# Patient Record
Sex: Female | Born: 1995 | Hispanic: No | Smoking: Never smoker
Health system: Southern US, Community
[De-identification: ages and names within clinical notes are randomized; demographics above are authoritative.]

## PROBLEM LIST (undated history)

## (undated) DIAGNOSIS — J45909 Unspecified asthma, uncomplicated: Secondary | ICD-10-CM

## (undated) DIAGNOSIS — E559 Vitamin D deficiency, unspecified: Secondary | ICD-10-CM

## (undated) DIAGNOSIS — D649 Anemia, unspecified: Secondary | ICD-10-CM

## (undated) DIAGNOSIS — F410 Panic disorder [episodic paroxysmal anxiety] without agoraphobia: Secondary | ICD-10-CM

## (undated) DIAGNOSIS — F4310 Post-traumatic stress disorder, unspecified: Secondary | ICD-10-CM

## (undated) DIAGNOSIS — F319 Bipolar disorder, unspecified: Secondary | ICD-10-CM

## (undated) HISTORY — PX: WISDOM TOOTH EXTRACTION: SHX21

## (undated) HISTORY — DX: Vitamin D deficiency, unspecified: E55.9

## (undated) HISTORY — DX: Anemia, unspecified: D64.9

---

## 2013-03-04 ENCOUNTER — Encounter (HOSPITAL_COMMUNITY): Payer: Self-pay | Admitting: Emergency Medicine

## 2013-03-04 ENCOUNTER — Emergency Department (HOSPITAL_COMMUNITY): Payer: BC Managed Care – PPO

## 2013-03-04 ENCOUNTER — Emergency Department (HOSPITAL_COMMUNITY)
Admission: EM | Admit: 2013-03-04 | Discharge: 2013-03-04 | Disposition: A | Discharge status: Home / Self Care | Payer: BC Managed Care – PPO | Attending: Emergency Medicine | Admitting: Emergency Medicine

## 2013-03-04 DIAGNOSIS — S5292XA Unspecified fracture of left forearm, initial encounter for closed fracture: Secondary | ICD-10-CM

## 2013-03-04 DIAGNOSIS — W06XXXA Fall from bed, initial encounter: Secondary | ICD-10-CM | POA: Insufficient documentation

## 2013-03-04 DIAGNOSIS — Y939 Activity, unspecified: Secondary | ICD-10-CM | POA: Insufficient documentation

## 2013-03-04 DIAGNOSIS — F17200 Nicotine dependence, unspecified, uncomplicated: Secondary | ICD-10-CM | POA: Insufficient documentation

## 2013-03-04 DIAGNOSIS — J45909 Unspecified asthma, uncomplicated: Secondary | ICD-10-CM | POA: Insufficient documentation

## 2013-03-04 DIAGNOSIS — Y929 Unspecified place or not applicable: Secondary | ICD-10-CM | POA: Insufficient documentation

## 2013-03-04 DIAGNOSIS — S52599A Other fractures of lower end of unspecified radius, initial encounter for closed fracture: Secondary | ICD-10-CM | POA: Insufficient documentation

## 2013-03-04 HISTORY — DX: Unspecified asthma, uncomplicated: J45.909

## 2013-03-04 MED ORDER — HYDROCODONE-ACETAMINOPHEN 5-325 MG PO TABS: 2.0000 | ORAL_TABLET | Freq: Four times a day (QID) | ORAL | Status: DC | PRN

## 2013-03-04 MED ORDER — HYDROCODONE-ACETAMINOPHEN 5-325 MG PO TABS
2.0000 | ORAL_TABLET | Freq: Once | ORAL | Status: AC
  Administered 2013-03-04: 2 via ORAL
  Filled 2013-03-04: qty 2

## 2013-03-04 NOTE — ED Notes (Signed)
Pt states that she has a loft bed and slipped and fell with all her weight on the left hand/wrist. Pt c/o pain 3/10. Pt does have swelling to left wrist and unable to rotate wrist at this time.

## 2013-03-04 NOTE — ED Notes (Signed)
Ortho tech at bedside 

## 2013-03-04 NOTE — ED Provider Notes (Signed)
CSN: [id number]     Arrival date & time 03/04/13  1356 History  This chart was scribed for non-physician practitioner Roxy Horseman, PA-C, working with Shanna Cisco, MD by Dorothey Baseman, ED Scribe. This patient was seen in room WTR9/WTR9 and the patient's care was started at 2:42 PM.    Chief Complaint  Patient presents with  . Wrist Pain    left   The history is provided by the patient. No language interpreter was used.   HPI Comments: Kelsey Shaffer is a 17 y.o. female who presents to the Emergency Department complaining of a constant pain, described as a stiffness, with associated swelling to the left hand and wrist onset around 2 hours ago secondary to fall from a lofted bed when she states that she placed all of her weight on the left hand and wrist. She denies taking any medications at home to manage her symptoms. She denies any allergies to medications. She denies history of fractures. Patient denies any other pertinent medical history.  Past Medical History  Diagnosis Date  . Asthma    History reviewed. No pertinent past surgical history. No family history on file. History  Substance Use Topics  . Smoking status: Light Tobacco Smoker  . Smokeless tobacco: Not on file  . Alcohol Use: Yes     Comment: social   OB History   Grav Para Term Preterm Abortions TAB SAB Ect Mult Living                 Review of Systems  A complete 10 system review of systems was obtained and all systems are negative except as noted in the HPI and PMH.   Allergies  Review of patient's allergies indicates no known allergies.  Home Medications  No current outpatient prescriptions on file.  Triage Vitals: BP 120/57  Pulse 89  Temp(Src) 98.3 F (36.8 C) (Oral)  Resp 17  Ht 5\' 5"  (1.651 m)  Wt 190 lb (86.183 kg)  BMI 31.62 kg/m2  SpO2 100%  LMP 02/23/2013  Physical Exam  Nursing note and vitals reviewed. Constitutional: She is oriented to person, place, and time. She appears  well-developed and well-nourished. No distress.  HENT:  Head: Normocephalic and atraumatic.  Eyes: Conjunctivae are normal.  Neck: Normal range of motion. Neck supple.  Cardiovascular: Intact distal pulses.   Brisk capillary refill.   Pulmonary/Chest: Effort normal. No respiratory distress.  Abdominal: She exhibits no distension.  Musculoskeletal: Normal range of motion.  Range of motion and strength limited secondary to pain.   Neurological: She is alert and oriented to person, place, and time.  Distal sensation intact.   Skin: Skin is warm and dry.  Psychiatric: She has a normal mood and affect. Her behavior is normal.    ED Course  Procedures (including critical care time)  DIAGNOSTIC STUDIES: Oxygen Saturation is 100% on room air, normal by my interpretation.    COORDINATION OF CARE: 2:44 PM- Discussed that x-ray results indicate a nondisplaced fracture to the left distal radial metaphysis. Will order pain medication and discharge patient with a splint to manage symptoms. Advised patient to follow up with the referred hand surgeon. Discussed treatment plan with patient at bedside and patient verbalized agreement.     Labs Review Labs Reviewed - No data to display  Imaging Review Dg Wrist Complete Left  03/04/2013   CLINICAL DATA:  Left lateral wrist pain status post fall  EXAM: LEFT WRIST - COMPLETE 3+ VIEW  COMPARISON:  None.  FINDINGS: There is a nondisplaced fracture of the left distal radial metaphysis with a fracture cleft extending to the articular surface of the radiocarpal joint. There is no other fracture or dislocation. There is surrounding soft tissue swelling.  IMPRESSION: Nondisplaced fracture of the left distal radial metaphysis extending to the articular surface.   Electronically Signed   By: Elige Ko   On: 03/04/2013 14:40   Dg Hand Complete Left  03/04/2013   CLINICAL DATA:  Left lateral wrist pain status post fall  EXAM: LEFT HAND - COMPLETE 3+ VIEW   COMPARISON:  None.  FINDINGS: There is a nondisplaced fracture of the left distal radial metaphysis extending to the articular surface. There is no other fracture or dislocation. There is severe soft tissue swelling along the volar aspect of the distal radial metaphysis.  IMPRESSION: Nondisplaced fracture of the left distal radial metaphysis extending to the articular surface.   Electronically Signed   By: Elige Ko   On: 03/04/2013 14:41    EKG Interpretation   None       MDM   1. Radius fracture, left, closed, initial encounter    Patient with radius fracture.  Will split and give ortho follow-up.  Patient understands and agrees with the plan.  I personally performed the services described in this documentation, which was scribed in my presence. The recorded information has been reviewed and is accurate.      Roxy Horseman, PA-C 03/04/13 [phone]

## 2013-03-04 NOTE — ED Notes (Signed)
Patient transported to X-ray 

## 2013-03-05 NOTE — ED Provider Notes (Signed)
Medical screening examination/treatment/procedure(s) were performed by non-physician practitioner and as supervising physician I was immediately available for consultation/collaboration.  Megan E Docherty, MD 03/05/13 1213 

## 2013-10-29 DIAGNOSIS — F32A Depression, unspecified: Secondary | ICD-10-CM | POA: Insufficient documentation

## 2013-10-30 DIAGNOSIS — F3160 Bipolar disorder, current episode mixed, unspecified: Secondary | ICD-10-CM | POA: Insufficient documentation

## 2013-10-30 DIAGNOSIS — F419 Anxiety disorder, unspecified: Secondary | ICD-10-CM | POA: Insufficient documentation

## 2014-10-16 ENCOUNTER — Emergency Department (HOSPITAL_COMMUNITY)
Admission: EM | Admit: 2014-10-16 | Discharge: 2014-10-16 | Disposition: A | Discharge status: Home / Self Care | Payer: Self-pay | Attending: Emergency Medicine | Admitting: Emergency Medicine

## 2014-10-16 ENCOUNTER — Encounter (HOSPITAL_COMMUNITY): Payer: Self-pay | Admitting: Emergency Medicine

## 2014-10-16 DIAGNOSIS — J45909 Unspecified asthma, uncomplicated: Secondary | ICD-10-CM | POA: Insufficient documentation

## 2014-10-16 DIAGNOSIS — Z79899 Other long term (current) drug therapy: Secondary | ICD-10-CM | POA: Insufficient documentation

## 2014-10-16 DIAGNOSIS — F319 Bipolar disorder, unspecified: Secondary | ICD-10-CM | POA: Insufficient documentation

## 2014-10-16 DIAGNOSIS — F4310 Post-traumatic stress disorder, unspecified: Secondary | ICD-10-CM | POA: Insufficient documentation

## 2014-10-16 DIAGNOSIS — Z720 Tobacco use: Secondary | ICD-10-CM | POA: Insufficient documentation

## 2014-10-16 DIAGNOSIS — F410 Panic disorder [episodic paroxysmal anxiety] without agoraphobia: Secondary | ICD-10-CM | POA: Insufficient documentation

## 2014-10-16 DIAGNOSIS — N390 Urinary tract infection, site not specified: Secondary | ICD-10-CM | POA: Insufficient documentation

## 2014-10-16 DIAGNOSIS — Z793 Long term (current) use of hormonal contraceptives: Secondary | ICD-10-CM | POA: Insufficient documentation

## 2014-10-16 HISTORY — DX: Panic disorder (episodic paroxysmal anxiety): F41.0

## 2014-10-16 HISTORY — DX: Post-traumatic stress disorder, unspecified: F43.10

## 2014-10-16 HISTORY — DX: Bipolar disorder, unspecified: F31.9

## 2014-10-16 LAB — URINE MICROSCOPIC-ADD ON

## 2014-10-16 LAB — URINALYSIS, ROUTINE W REFLEX MICROSCOPIC
Bilirubin Urine: NEGATIVE
Glucose, UA: NEGATIVE mg/dL
KETONES UR: NEGATIVE mg/dL
Nitrite: NEGATIVE
PROTEIN: 100 mg/dL — AB
Specific Gravity, Urine: 1.025 (ref 1.005–1.030)
UROBILINOGEN UA: 0.2 mg/dL (ref 0.0–1.0)
pH: 6 (ref 5.0–8.0)

## 2014-10-16 MED ORDER — CEPHALEXIN 500 MG PO CAPS: 500.0000 mg | ORAL_CAPSULE | Freq: Two times a day (BID) | ORAL | Status: DC

## 2014-10-16 NOTE — ED Provider Notes (Signed)
CSN: [id number]     Arrival date & time 10/16/14  1655 History   First MD Initiated Contact with Patient 10/16/14 1858     Chief Complaint  Patient presents with  . Dysuria  . Chills     (Consider location/radiation/quality/duration/timing/severity/associated sxs/prior Treatment) HPI   Blood pressure 120/67, pulse 93, temperature 98.1 F (36.7 C), temperature source Oral, resp. rate 18, last menstrual period 09/25/2014, SpO2 99 %.  Kelsey Shaffer is a 19 y.o. female complaining of dysuria, urinary frequency and chills onset 10 days ago. Patient states that she started to self medicate with Azo and cranberry juice she states that she improved but then symptoms returned. She denies fever, nausea, vomiting, flank pain, abnormal vaginal discharge. States that she did not seek care immediately because she is uninsured and is concerned about money.  Past Medical History  Diagnosis Date  . Asthma   . Bipolar affective   . Panic anxiety syndrome   . PTSD (post-traumatic stress disorder)    No past surgical history on file. No family history on file. History  Substance Use Topics  . Smoking status: Light Tobacco Smoker  . Smokeless tobacco: Not on file  . Alcohol Use: Yes     Comment: social   OB History    No data available     Review of Systems  10 systems reviewed and found to be negative, except as noted in the HPI.   Allergies  Review of patient's allergies indicates no known allergies.  Home Medications   Prior to Admission medications   Medication Sig Start Date End Date Taking? Authorizing Provider  albuterol (PROVENTIL HFA;VENTOLIN HFA) 108 (90 BASE) MCG/ACT inhaler Inhale 2 puffs into the lungs every 6 (six) hours as needed for wheezing or shortness of breath.    Historical Provider, MD  cephALEXin (KEFLEX) 500 MG capsule Take 1 capsule (500 mg total) by mouth 2 (two) times daily. 10/16/14   Iann Rodier, PA-C  HYDROcodone-acetaminophen (NORCO/VICODIN) 5-325  MG per tablet Take 2 tablets by mouth every 6 (six) hours as needed. 03/04/13   Roxy Horseman, PA-C  Norethin-Eth Estrad-Fe Biphas (LO LOESTRIN FE PO) Take 1 tablet by mouth daily.    Historical Provider, MD  PRESCRIPTION MEDICATION Antibiotic unknown    Historical Provider, MD  sertraline (ZOLOFT) 25 MG tablet Take 75 mg by mouth at bedtime.    Historical Provider, MD   BP 120/67 mmHg  Pulse 93  Temp(Src) 98.1 F (36.7 C) (Oral)  Resp 18  SpO2 99%  LMP 09/25/2014 (Approximate) Physical Exam  Constitutional: She is oriented to person, place, and time. She appears well-developed and well-nourished. No distress.  HENT:  Head: Normocephalic and atraumatic.  Mouth/Throat: Oropharynx is clear and moist.  Eyes: Conjunctivae and EOM are normal. Pupils are equal, round, and reactive to light.  Neck: Normal range of motion.  Cardiovascular: Normal rate, regular rhythm and intact distal pulses.   Pulmonary/Chest: Effort normal and breath sounds normal.  Abdominal: Soft. There is no tenderness.  Genitourinary:  No CVAT b/l  Musculoskeletal: Normal range of motion.  Neurological: She is alert and oriented to person, place, and time.  Skin: She is not diaphoretic.  Psychiatric: She has a normal mood and affect.  Nursing note and vitals reviewed.   ED Course  Procedures (including critical care time) Labs Review Labs Reviewed  URINALYSIS, ROUTINE W REFLEX MICROSCOPIC (NOT AT [facility]) - Abnormal; Notable for the following:    APPearance TURBID (*)    Hgb  urine dipstick LARGE (*)    Protein, ur 100 (*)    Leukocytes, UA LARGE (*)    All other components within normal limits  URINE MICROSCOPIC-ADD ON - Abnormal; Notable for the following:    Bacteria, UA FEW (*)    All other components within normal limits  URINE CULTURE  POC URINE PREG, ED    Imaging Review No results found.   EKG Interpretation None      MDM   Final diagnoses:  UTI (lower urinary tract infection)     Filed Vitals:   10/16/14 1705 10/16/14 1919  BP: 124/72 120/67  Pulse: 95 93  Temp: 98.1 F (36.7 C)   TempSrc: Oral   Resp: 16 18  SpO2: 100% 99%    Kelsey Shaffer is a pleasant 19 y.o. female presenting with dysuria and urinary frequency. No signs of systemic infection, no CVA tenderness to palpation. Patient given a prescription for Keflex, which had extensive discussion of return precautions. Urine culture is pending. Medication given in the ED because of cost. States she will obtain antibiotics immediately. I've counseled her on where to go for reasonably price outpatient antibiotics.  Evaluation does not show pathology that would require ongoing emergent intervention or inpatient treatment. Pt is hemodynamically stable and mentating appropriately. Discussed findings and plan with patient/guardian, who agrees with care plan. All questions answered. Return precautions discussed and outpatient follow up given.   Discharge Medication List as of 10/16/2014  7:13 PM    START taking these medications   Details  cephALEXin (KEFLEX) 500 MG capsule Take 1 capsule (500 mg total) by mouth 2 (two) times daily., Starting 10/16/2014, Until Discontinued, Print             Kelsey Emeryicole Jazara Swiney, PA-C 10/16/14 2120  Lorre NickAnthony Allen, MD 10/17/14 2037

## 2014-10-16 NOTE — Discharge Instructions (Signed)
°Take your antibiotics as directed and to completion. You should never have any leftover antibiotics! Push fluids and stay well hydrated.  ° °Any antibiotic use can reduce the efficacy of hormonal birth control. Please use back up method of contraception.  ° °Do not hesitate to return to the emergency room for any new, worsening or concerning symptoms. ° °Please obtain primary care using resource guide below. Let them know that you were seen in the emergency room and that they will need to obtain records for further outpatient management. ° ° °Urinary Tract Infection °Urinary tract infections (UTIs) can develop anywhere along your urinary tract. Your urinary tract is your body's drainage system for removing wastes and extra water. Your urinary tract includes two kidneys, two ureters, a bladder, and a urethra. Your kidneys are a pair of bean-shaped organs. Each kidney is about the size of your fist. They are located below your ribs, one on each side of your spine. °CAUSES °Infections are caused by microbes, which are microscopic organisms, including fungi, viruses, and bacteria. These organisms are so small that they can only be seen through a microscope. Bacteria are the microbes that most commonly cause UTIs. °SYMPTOMS  °Symptoms of UTIs may vary by age and gender of the patient and by the location of the infection. Symptoms in young women typically include a frequent and intense urge to urinate and a painful, burning feeling in the bladder or urethra during urination. Older women and men are more likely to be tired, shaky, and weak and have muscle aches and abdominal pain. A fever may mean the infection is in your kidneys. Other symptoms of a kidney infection include pain in your back or sides below the ribs, nausea, and vomiting. °DIAGNOSIS °To diagnose a UTI, your caregiver will ask you about your symptoms. Your caregiver also will ask to provide a urine sample. The urine sample will be tested for bacteria and  white blood cells. White blood cells are made by your body to help fight infection. °TREATMENT  °Typically, UTIs can be treated with medication. Because most UTIs are caused by a bacterial infection, they usually can be treated with the use of antibiotics. The choice of antibiotic and length of treatment depend on your symptoms and the type of bacteria causing your infection. °HOME CARE INSTRUCTIONS °· If you were prescribed antibiotics, take them exactly as your caregiver instructs you. Finish the medication even if you feel better after you have only taken some of the medication. °· Drink enough water and fluids to keep your urine clear or pale yellow. °· Avoid caffeine, tea, and carbonated beverages. They tend to irritate your bladder. °· Empty your bladder often. Avoid holding urine for long periods of time. °· Empty your bladder before and after sexual intercourse. °· After a bowel movement, women should cleanse from front to back. Use each tissue only once. °SEEK MEDICAL CARE IF:  °· You have back pain. °· You develop a fever. °· Your symptoms do not begin to resolve within 3 days. °SEEK IMMEDIATE MEDICAL CARE IF:  °· You have severe back pain or lower abdominal pain. °· You develop chills. °· You have nausea or vomiting. °· You have continued burning or discomfort with urination. °MAKE SURE YOU:  °· Understand these instructions. °· Will watch your condition. °· Will get help right away if you are not doing well or get worse. °Document Released: 01/13/2005 Document Revised: 10/05/2011 Document Reviewed: 05/14/2011 °ExitCare® Patient Information ©2015 [employer]. This information is   not intended to replace advice given to you by your health care provider. Make sure you discuss any questions you have with your health care provider. ° ° °Emergency Department Resource Guide °1) Find a Doctor and Pay Out of Pocket °Although you won't have to find out who is covered by your insurance plan, it is a good idea to  ask around and get recommendations. You will then need to call the office and see if the doctor you have chosen will accept you as a new patient and what types of options they offer for patients who are self-pay. Some doctors offer discounts or will set up payment plans for their patients who do not have insurance, but you will need to ask so you aren't surprised when you get to your appointment. ° °2) Contact Your Local Health Department °Not all health departments have doctors that can see patients for sick visits, but many do, so it is worth a call to see if yours does. If you don't know where your local health department is, you can check in your phone book. The CDC also has a tool to help you locate your state's health department, and many state websites also have listings of all of their local health departments. ° °3) Find a Walk-in Clinic °If your illness is not likely to be very severe or complicated, you may want to try a walk in clinic. These are popping up all over the country in pharmacies, drugstores, and shopping centers. They're usually staffed by nurse practitioners or physician assistants that have been trained to treat common illnesses and complaints. They're usually fairly quick and inexpensive. However, if you have serious medical issues or chronic medical problems, these are probably not your best option. ° °No Primary Care Doctor: °- Call [facility] at  832-8000 - they can help you locate a primary care doctor that  accepts your insurance, provides certain services, etc. °- Physician Referral Service- 1-800-533-3463 ° °Chronic Pain Problems: °Organization         Address  Phone   Notes  °[facility]  (336) 297-2271 Patients need to be referred by their primary care doctor.  ° °Medication Assistance: °Organization         Address  Phone   Notes  °[place] [facility] [address] °[place], [place] [address] (336) 641-8030 --Must be a  resident of [place] °-- Must have NO insurance coverage whatsoever (no Medicaid/ Medicare, etc.) °-- The pt. MUST have a primary care doctor that directs their care regularly and follows them in the community °  °[facility]  (866) 331-1348   °[employer]  (888) 892-1162   ° °Agencies that provide inexpensive medical care: °Organization         Address  Phone   Notes  °[facility]  (336) 832-8035   °[facility]    (336) 832-7272   °[facility] [address] °[place], [place] [address] (336) 832-4777   °[facility] [address], °[place] (336) 271-4999   °[facility]    (336) 373-0678   °[facility]    (336) 272-1050   °[facility] ° [address], [place] Phone:  (336) 832-4444, Fax:  (336) 832-4440 Hours of Operation:  9 am - 6 pm, M-F.  Also accepts Medicaid/Medicare and self-pay.  °[facility] ° [address], [place] Phone: (336) 832-3150, Fax: (336) 832-3151. Hours of Operation:  8:30 am - 5:30 pm, M-F.  Also accepts Medicaid and self-pay.  °[facility] [place] [address], [place] Phone: (336) 878-6027   °[facility] [address], [place], [place] (336)723-1848, Ext. 123 Mondays & Thursdays: 7-9 AM.  First 15 patients are seen on a first come, first serve basis. °  ° °Medicaid-accepting [place] Providers: ° °Organization         Address  Phone   Notes  °[facility] [address], [place] (336) 641-2100 Also accepts self-pay patients.  °[facility] [address], [place] ° (336) 856-9996   °[facility] [address], [place] (336) 288-8857   °[facility] [address] [place] [address], [place] (336) 299-7000   °Veita Bland [address], [place]  ° (336) 373-1557 Only accepts  [place] Access Medicaid patients after they have their name applied to their card.  ° °Self-Pay (no insurance) in [place]: ° °Organization         Address  Phone   Notes  °Sickle Cell Patients, [facility] [address], [place] (336) 832-1970   °[facility] [address], [place] (336) 832-4400   °[facility] ° [address], [place] (336) 992-4800   °[facility]/Dr. Osei-Bonsu ° [address] [place] [address], [place] or [address], [place] (336) 841-8500 Phone number for both [place] and [place] locations is the same.  °[facility] [address], [place] (336) 299-0000   °[facility] [address] [place] [address], [place] or [address] (336) 852-7530 °(336) 878-2260   °[facility] [address], [place] (336) 350-1642, phone; (336) 294-5005, fax Sees patients 1st and 3rd Saturday of every month.  Must not qualify for public or private insurance (i.e. Medicaid, Medicare, [place] Health Choice, Veterans' Benefits) • Household income should be no more than 200% of the poverty level •The clinic cannot treat you if you are pregnant or think you are pregnant • Sexually transmitted diseases are not treated at the clinic.  ° ° °Dental Care: °Organization         Address  Phone  Notes  °[facility] [address], [place] (336) 641-6152 Accepts children up to age 21 who are enrolled in Medicaid or [place] Health Choice; pregnant women with a Medicaid card; and children who have applied for Medicaid or [place] Health Choice, but were declined, whose parents can pay a reduced fee at time of service.  °[facility] [place]  [address], [place] (336) 641-7733 Accepts children up to age 21 who are enrolled in Medicaid or [place] Health Choice; pregnant women  with a Medicaid card; and children who have applied for Medicaid or [place] Health Choice, but were declined, whose parents can pay a reduced fee at time of service.  °[facility] ° [address], [place] (336) 641-4533 Patients are seen by appointment only. Walk-ins are not accepted. [facility] will see patients 18 years of age and older. °Monday - Tuesday (8am-5pm) °Most Wednesdays (8:30-5pm) °$30 per visit, cash only  °[facility] ° [address], [place] (336) 641-4533 Patients are seen by appointment only. Walk-ins are not accepted. [facility] will see patients 18 years of age and older. °One Wednesday Evening (Monthly: Volunteer Based).  $30 per visit, cash only  °[facility]  (919) 537-3737 for adults; Children under age 4, call Graduate Pediatric Dentistry at (919) [phone]. Children aged 4-14, please call (919) [phone] to request a pediatric application. ° Dental services are provided in all areas of dental care including fillings, crowns and bridges, complete and partial dentures, implants, gum treatment, root canals, and extractions. Preventive care is also provided. Treatment is provided to both adults and children. °Patients are selected via a lottery and there is often a waiting list. °  °[facility] [address], °[place] ° (336) 763-8833 [email or web address] °  °[facility] [address], [place], [place] (336)723-1848, Ext. 123 Second and Fourth Thursday of each month, opens at 6:30 AM; Clinic ends at 9 AM.  Patients are seen on a first-come first-served basis, and a limited number are seen during each clinic.  ° °[facility] ° [address], [place], [place] (336) 723-7904   Eligibility Requirements °You must have lived in [place], [place], or [place] counties for at least the last three months. °  You cannot be eligible for state or federal sponsored healthcare  insurance, including Veterans Administration, Medicaid, or Medicare. °  You generally cannot be eligible for healthcare insurance through your employer.  °  How to apply: °Eligibility screenings are held every Tuesday and Wednesday afternoon from 1:00 pm until 4:00 pm. You do not need an appointment for the interview!  °[facility] [address], [place], [place] 336-631-2330   °[facility]  336-342-8273   °[facility]  336-703-3100   °[facility]  336-570-6415   ° °Behavioral Health Resources in the Community: °Intensive Outpatient Programs °Organization         Address  Phone  Notes  °[facility] [address], [place], [place] 336-878-6098   °[facility] [address], [place], [place] 336-832-9800   °[facility] [address], [place], [place] ° 336-882-2125   °[facility] [address],  °[place], [place] 1-800-853-5163 or 336-641-4981   °Substance Abuse Resources °Organization         Address  Phone  Notes  °[facility]  336-882-2125   °[facility]  336-784-9470   °[facility]  336-285-9073   °[facility]  336-845-3988   °Residential & Outpatient Substance Abuse Program  1-800-659-3381   °Psychological Services °Organization         Address  Phone  Notes  °[facility]  336- 832-9600   °[facility]  336- 378-7881   °[facility] [address], [place] 1-800-853-5163 or 336-641-4981   ° °Mobile Crisis Teams °Organization         Address  Phone  Notes  °[facility], Mobile Crisis Care Unit  1-877-626-1772   °Assertive °[facility] ° [address] [place], [place] 336-834-9664   °Sharon DeEsch [address] °[place] 336-554-5454   ° °Self-Help/Support Groups °Organization         Address  Phone             Notes  °[facility] [place] -   variety of support groups  336- 373-1402 Call for more information  °Narcotics Anonymous (NA), [facility] [address], °[place]  2 meetings at this location  ° °Residential Treatment Programs °Organization         Address  Phone  Notes  °[facility] [address],    °[place]  1-866-801-8205   °[facility] ° [address], [place], [place] 704-293-8524   °[facility] [address], [place] 336-845-3988 Admissions: 8am-3pm M-F  °[facility] [address]    °[place], [place] 336-841-1104   °[facility] [address], [place], [place] 336-379-7146   °[facility] [address]  °[place], [place] 336-285-9073   °[facility] - Intensive Outpatient [address], [place], [place] 336-852-3033   °[facility] [address]  °[place], [place] 1-877-615-2722 or 336-784-9470   °[facility] [address] [place], [place] 336-227-7417 Accepts Medicaid  °[facility] [address]  °[place] 1-800-659-3381 Substance Abuse/Addiction Treatment  ° °[place] Behavioral Health Resources °Organization         Address  Phone  Notes  °[facility]  (888) 581-9988   °Julie Brannon, PhD [address] [place], [place]   (336) 349-5553 or (336) 951-0000   °[facility]   [address] °[place], [place] (336) 349-4454   °[facility] [address], [place], [place] (336) 342-8316 Insurance/Medicaid/sponsorship through [facility]  °[facility] [address]                                    [place], [place] (336) 342-8316 Therapy/tele-psych/case  °[facility] [address]  ° [place], [place] (336) 349-2233    °Dr. Arfeen  (336) 349-4544   °[facility] [place]  [employer] [place] [facility] 1) [address],  [place] °2) [address], [place] °3)  [address], [place] (336) 349-3220 °(336) 342-7768 ° °(336) 342-8140   °[place] [facility] (336) 342-1394 or (336) 342-3537 (After Hours)    ° ° ° °

## 2014-10-16 NOTE — ED Notes (Signed)
Pt states that she has had UTI symptoms x several days including dysuria and has also had chills. Alert and oriented.

## 2014-11-21 LAB — URINE CULTURE: Culture: 30000

## 2014-11-22 NOTE — Progress Notes (Signed)
ED Antimicrobial Stewardship Positive Culture Follow Up   Kelsey Shaffer is an 19 y.o. female who presented to [facility] on 10/16/2014 with a chief complaint of  Chief Complaint  Patient presents with  . Dysuria  . Chills    No results found for this or any previous visit (from the past 720 hour(s)).   Treated with cephalexin, organism resistant to prescribed antimicrobial  Plan: Contact patient to determine if still having symptoms since seen in the ED on 6/29. If so, can treat with Bactrim DS 1 tablet twice daily for 7 days,   ED Provider: Jaynie Crumble, PA-C   Ancil Boozer 11/22/2014, 8:56 AM Infectious Diseases Pharmacist Phone# [phone]

## 2014-11-23 ENCOUNTER — Telehealth (HOSPITAL_COMMUNITY): Payer: Self-pay | Admitting: Emergency Medicine

## 2014-11-23 NOTE — Telephone Encounter (Signed)
Post ED Visit - Positive Culture Follow-up: Successful Patient Follow-Up  Culture assessed and recommendations reviewed by:  Celedonio Miyamoto, Pharm.D., BCPS-AQ ID  Georgina Pillion, Pharm.D., BCPS  [place], [place].D., BCPS, AAHIVP  Estella Husk, Pharm.D., BCPS, AAHIVP  Tegan Magsam, Pharm.D.  Casilda Carls, Pharm.D.  Positive Urine  Culture, postiive, salmonella   Patient discharged without antimicrobial prescription and treatment is now indicated  Organism is resistant to prescribed ED discharge antimicrobial  Patient with positive blood cultures  Changes discussed with ED provider:Tatyana Kirichenko, PA New antibiotic prescription: if symptomatic, Bactrim DS one tab BID x seven days   Contacted patient, date 11/23/14 , time 1719 ID verified, pt notiifed and states that she was contacted by [facility] last month. Denies symptoms.   Jiles Harold 11/23/2014, 5:23 PM

## 2015-02-02 ENCOUNTER — Emergency Department (HOSPITAL_COMMUNITY)
Admission: EM | Admit: 2015-02-02 | Discharge: 2015-02-02 | Disposition: A | Discharge status: Home / Self Care | Payer: No Typology Code available for payment source | Attending: Emergency Medicine | Admitting: Emergency Medicine

## 2015-02-02 ENCOUNTER — Emergency Department (HOSPITAL_COMMUNITY): Payer: No Typology Code available for payment source

## 2015-02-02 ENCOUNTER — Encounter (HOSPITAL_COMMUNITY): Payer: Self-pay | Admitting: *Deleted

## 2015-02-02 DIAGNOSIS — W540XXA Bitten by dog, initial encounter: Secondary | ICD-10-CM | POA: Diagnosis not present

## 2015-02-02 DIAGNOSIS — J45909 Unspecified asthma, uncomplicated: Secondary | ICD-10-CM | POA: Insufficient documentation

## 2015-02-02 DIAGNOSIS — Y9389 Activity, other specified: Secondary | ICD-10-CM | POA: Diagnosis not present

## 2015-02-02 DIAGNOSIS — Y9289 Other specified places as the place of occurrence of the external cause: Secondary | ICD-10-CM | POA: Diagnosis not present

## 2015-02-02 DIAGNOSIS — Z792 Long term (current) use of antibiotics: Secondary | ICD-10-CM | POA: Insufficient documentation

## 2015-02-02 DIAGNOSIS — Z79899 Other long term (current) drug therapy: Secondary | ICD-10-CM | POA: Insufficient documentation

## 2015-02-02 DIAGNOSIS — Y998 Other external cause status: Secondary | ICD-10-CM | POA: Insufficient documentation

## 2015-02-02 DIAGNOSIS — F4310 Post-traumatic stress disorder, unspecified: Secondary | ICD-10-CM | POA: Insufficient documentation

## 2015-02-02 DIAGNOSIS — F319 Bipolar disorder, unspecified: Secondary | ICD-10-CM | POA: Diagnosis not present

## 2015-02-02 DIAGNOSIS — Z23 Encounter for immunization: Secondary | ICD-10-CM | POA: Insufficient documentation

## 2015-02-02 DIAGNOSIS — S81831A Puncture wound without foreign body, right lower leg, initial encounter: Secondary | ICD-10-CM | POA: Diagnosis not present

## 2015-02-02 DIAGNOSIS — F410 Panic disorder [episodic paroxysmal anxiety] without agoraphobia: Secondary | ICD-10-CM | POA: Insufficient documentation

## 2015-02-02 DIAGNOSIS — Z720 Tobacco use: Secondary | ICD-10-CM | POA: Insufficient documentation

## 2015-02-02 DIAGNOSIS — S91051A Open bite, right ankle, initial encounter: Secondary | ICD-10-CM | POA: Diagnosis not present

## 2015-02-02 MED ORDER — TETANUS-DIPHTH-ACELL PERTUSSIS 5-2.5-18.5 LF-MCG/0.5 IM SUSP
0.5000 mL | Freq: Once | INTRAMUSCULAR | Status: AC
  Administered 2015-02-02: 0.5 mL via INTRAMUSCULAR
  Filled 2015-02-02: qty 0.5

## 2015-02-02 MED ORDER — AMOXICILLIN-POT CLAVULANATE 875-125 MG PO TABS: 1.0000 | ORAL_TABLET | Freq: Two times a day (BID) | ORAL | Status: DC

## 2015-02-02 NOTE — Discharge Instructions (Signed)
Take your medications as prescribed. Take all your medications, do not save or share them. Follow-up with your doctor in one week for wound recheck. Return to ED for worsening symptoms including fevers, chills, worsening redness around her wound.

## 2015-02-02 NOTE — ED Notes (Addendum)
PT reports a dog bite from a friends dog. Pt reports the dog is up to date on rabies vac. Pt reports a bite at 2 sites on RT lower leg. PT was treated by EMS at site. Pt had an ace wrap to RT ankle because of pain.

## 2015-02-02 NOTE — ED Provider Notes (Signed)
CSN: [id number]     Arrival date & time 02/02/15  1844 History  By signing my name below, I, Kelsey Shaffer, attest that this documentation has been prepared under the direction and in the presence of [employer], PA-C. Electronically Signed: Evon Shaffer, ED Scribe. 02/02/2015. 9:42 PM.      Chief Complaint  Patient presents with  . Animal Bite   HPI HPI Comments: Kelsey Shaffer is a 19 y.o. female who presents to the Emergency Department complaining of several dog bites to the right lower leg onset 12 hours PTA. Pt presents with bites to the lateral right leg and right ankle. Pt reports tingling to the dorsum of her right foot. Pt states that dogs vaccinations are UTD. Pt states that her last tetanus was 8 years prior. Pt states she did clean the wounds PTA with saline rinse and hydrogen peroxide. Pt has had ibuprofen that has provided relief of the pain.    Past Medical History  Diagnosis Date  . Asthma   . Bipolar affective (HCC)   . Panic anxiety syndrome   . PTSD (post-traumatic stress disorder)    History reviewed. No pertinent past surgical history. History reviewed. No pertinent family history. Social History  Substance Use Topics  . Smoking status: Light Tobacco Smoker  . Smokeless tobacco: None  . Alcohol Use: Yes     Comment: social   OB History    No data available     Review of Systems  Skin: Positive for wound.  All other systems reviewed and are negative.    Allergies  Review of patient's allergies indicates no known allergies.  Home Medications   Prior to Admission medications   Medication Sig Start Date End Date Taking? Authorizing Provider  albuterol (PROVENTIL HFA;VENTOLIN HFA) 108 (90 BASE) MCG/ACT inhaler Inhale 2 puffs into the lungs every 6 (six) hours as needed for wheezing or shortness of breath.    Historical Provider, MD  amoxicillin-clavulanate (AUGMENTIN) 875-125 MG tablet Take 1 tablet by mouth every 12 (twelve) hours.  02/02/15   Joycie Peek, PA-C  cephALEXin (KEFLEX) 500 MG capsule Take 1 capsule (500 mg total) by mouth 2 (two) times daily. 10/16/14   Nicole Pisciotta, PA-C  HYDROcodone-acetaminophen (NORCO/VICODIN) 5-325 MG per tablet Take 2 tablets by mouth every 6 (six) hours as needed. 03/04/13   Roxy Horseman, PA-C  Norethin-Eth Estrad-Fe Biphas (LO LOESTRIN FE PO) Take 1 tablet by mouth daily.    Historical Provider, MD  PRESCRIPTION MEDICATION Antibiotic unknown    Historical Provider, MD  sertraline (ZOLOFT) 25 MG tablet Take 75 mg by mouth at bedtime.    Historical Provider, MD   BP 126/71 mmHg  Pulse 97  Temp(Src) 98 F (36.7 C) (Oral)  Ht  (1.651 m)  Wt 195 lb (88.451 kg)  BMI 32.45 kg/m2  SpO2 100%  LMP 01/26/2015   Physical Exam  Constitutional: She is oriented to person, place, and time. She appears well-developed and well-nourished. No distress.  HENT:  Head: Normocephalic and atraumatic.  Eyes: Conjunctivae and EOM are normal.  Neck: Neck supple. No tracheal deviation present.  Cardiovascular: Normal rate, regular rhythm and normal heart sounds.   Pulmonary/Chest: Effort normal and breath sounds normal. No respiratory distress.  Musculoskeletal: Normal range of motion.  Mild diffuse swelling to right ankle. Dorsiflexion slightly decreased secondary to pain. Plantarflexion intact. No bony tenderness to base of fifth metatarsal or medial or lateral malleolus. Full range of motion of right knee.  Neurological: She  is alert and oriented to person, place, and time.  Motor strength appears intact and baseline for patient. Sensation intact to light touch, however does have some mild paresthesias on dorsum of right foot.  Skin: Skin is warm and dry.  2 puncture wounds to lateral aspect of right proximal calf. 2 punctate wounds to distal tibia soft tissue, distal pulse intact.   Psychiatric: She has a normal mood and affect. Her behavior is normal.  Nursing note and vitals  reviewed.   ED Course  Procedures (including critical care time) DIAGNOSTIC STUDIES: Oxygen Saturation is 100% on RA, normal by my interpretation.    COORDINATION OF CARE: 7:19 PM-Discussed treatment plan with pt at bedside and pt agreed to plan.     Labs Review Labs Reviewed - No data to display  Imaging Review Dg Tibia/fibula Right  02/02/2015  CLINICAL DATA:  Dog bite with puncture wounds at the distal ankle. Initial encounter. EXAM: RIGHT TIBIA AND FIBULA - 2 VIEW COMPARISON:  None. FINDINGS: There is soft tissue swelling and gas ventral and lateral to the ankle and lateral to the proximal fibula. No underlying fracture or opaque foreign body. IMPRESSION: Puncture wounds without opaque foreign body or fracture. Electronically Signed   By: Marnee SpringJonathon  Watts M.D.   On: 02/02/2015 20:46   Dg Ankle Complete Right  02/02/2015  CLINICAL DATA:  Dog bite to the right ankle. EXAM: RIGHT ANKLE - COMPLETE 3+ VIEW COMPARISON:  None. FINDINGS: No fracture. Ankle mortise is normally space and aligned. There is soft tissue air along the deep subcutaneous soft tissues of the anterior ankle. No radiopaque foreign body. IMPRESSION: No fracture or ankle joint abnormality. No radiopaque foreign body. Electronically Signed   By: Amie Portlandavid  Ormond M.D.   On: 02/02/2015 20:46      EKG Interpretation None     Meds given in ED:  Medications  Tdap (BOOSTRIX) injection 0.5 mL (0.5 mLs Intramuscular Given 02/02/15 1925)    New Prescriptions   AMOXICILLIN-CLAVULANATE (AUGMENTIN) 875-125 MG TABLET    Take 1 tablet by mouth every 12 (twelve) hours.   Filed Vitals:   02/02/15 1905  BP: 126/71  Pulse: 97  Temp: 98 F (36.7 C)  TempSrc: Oral  Height: 5\' 5"  (1.651 m)  Weight: 195 lb (88.451 kg)  SpO2: 100%    MDM  Matt HolmesVictoria Shaffer is a 19 y.o. female presents after dog bite today at approximately 12:00 PM. Dog is known. Up-to-date on immunizations. Patient unsure of last tetanus, thinks it was 8  years ago. Tetanus updated in ED. Puncture wounds noted to right calf and distal tibia soft tissue. Neurovascularly intact, distal pulses intact. X-rays obtained of right tib-fib and ankle and are negative for fracture, osseous abnormality or foreign body. Wounds were irrigated by myself at bedside. No indication for suturing. We'll discharge with Augmentin and PCP follow-up. No evidence of other acute or emergent pathology at this time. Strict return precautions given. Patient overall appears well, hemodynamically stable, afebrile and appropriate for discharge. Final diagnoses:  Dog bite     I personally performed the services described in this documentation, which was scribed in my presence. The recorded information has been reviewed and is accurate.     Joycie PeekBenjamin Princess Karnes, PA-C 02/02/15 2142  Joycie PeekBenjamin Derak Schurman, PA-C 02/02/15 2147  Arby BarretteMarcy Pfeiffer, MD 02/08/15 [phone]

## 2015-02-02 NOTE — ED Notes (Signed)
Applied dressing to wounds and reviewed discharge instructions/prescription medication with patient and her family. Patient verbalized understanding.

## 2016-12-14 IMAGING — CR DG ANKLE COMPLETE 3+V*R*
3 series · 3 of 3 positions shown · non-contrast
Comparison: None.

CLINICAL DATA: Dog bite to the right ankle.

EXAM:
RIGHT ANKLE - COMPLETE 3+ VIEW

[ankle ap]
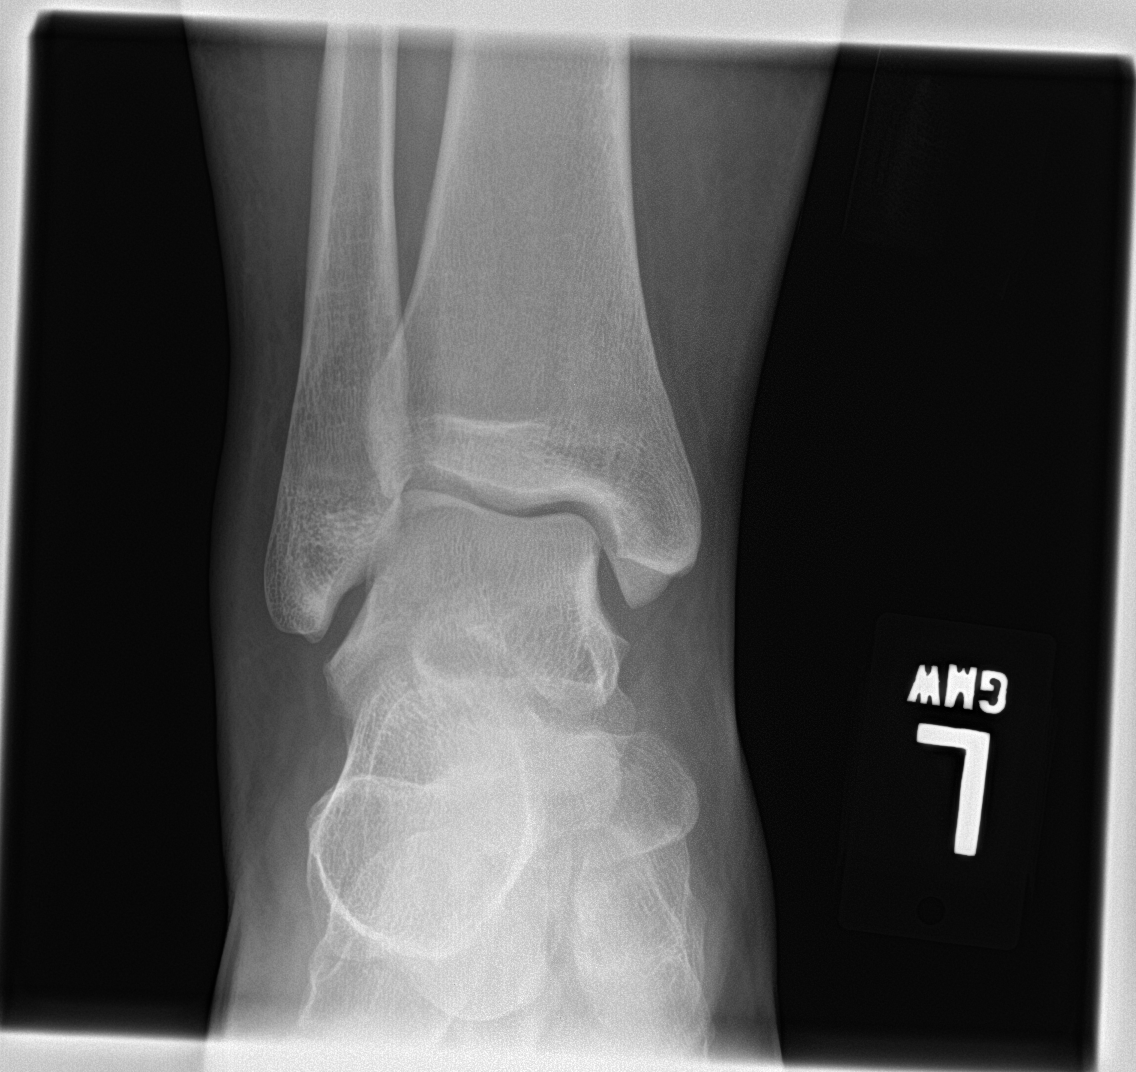

[ankle obl]
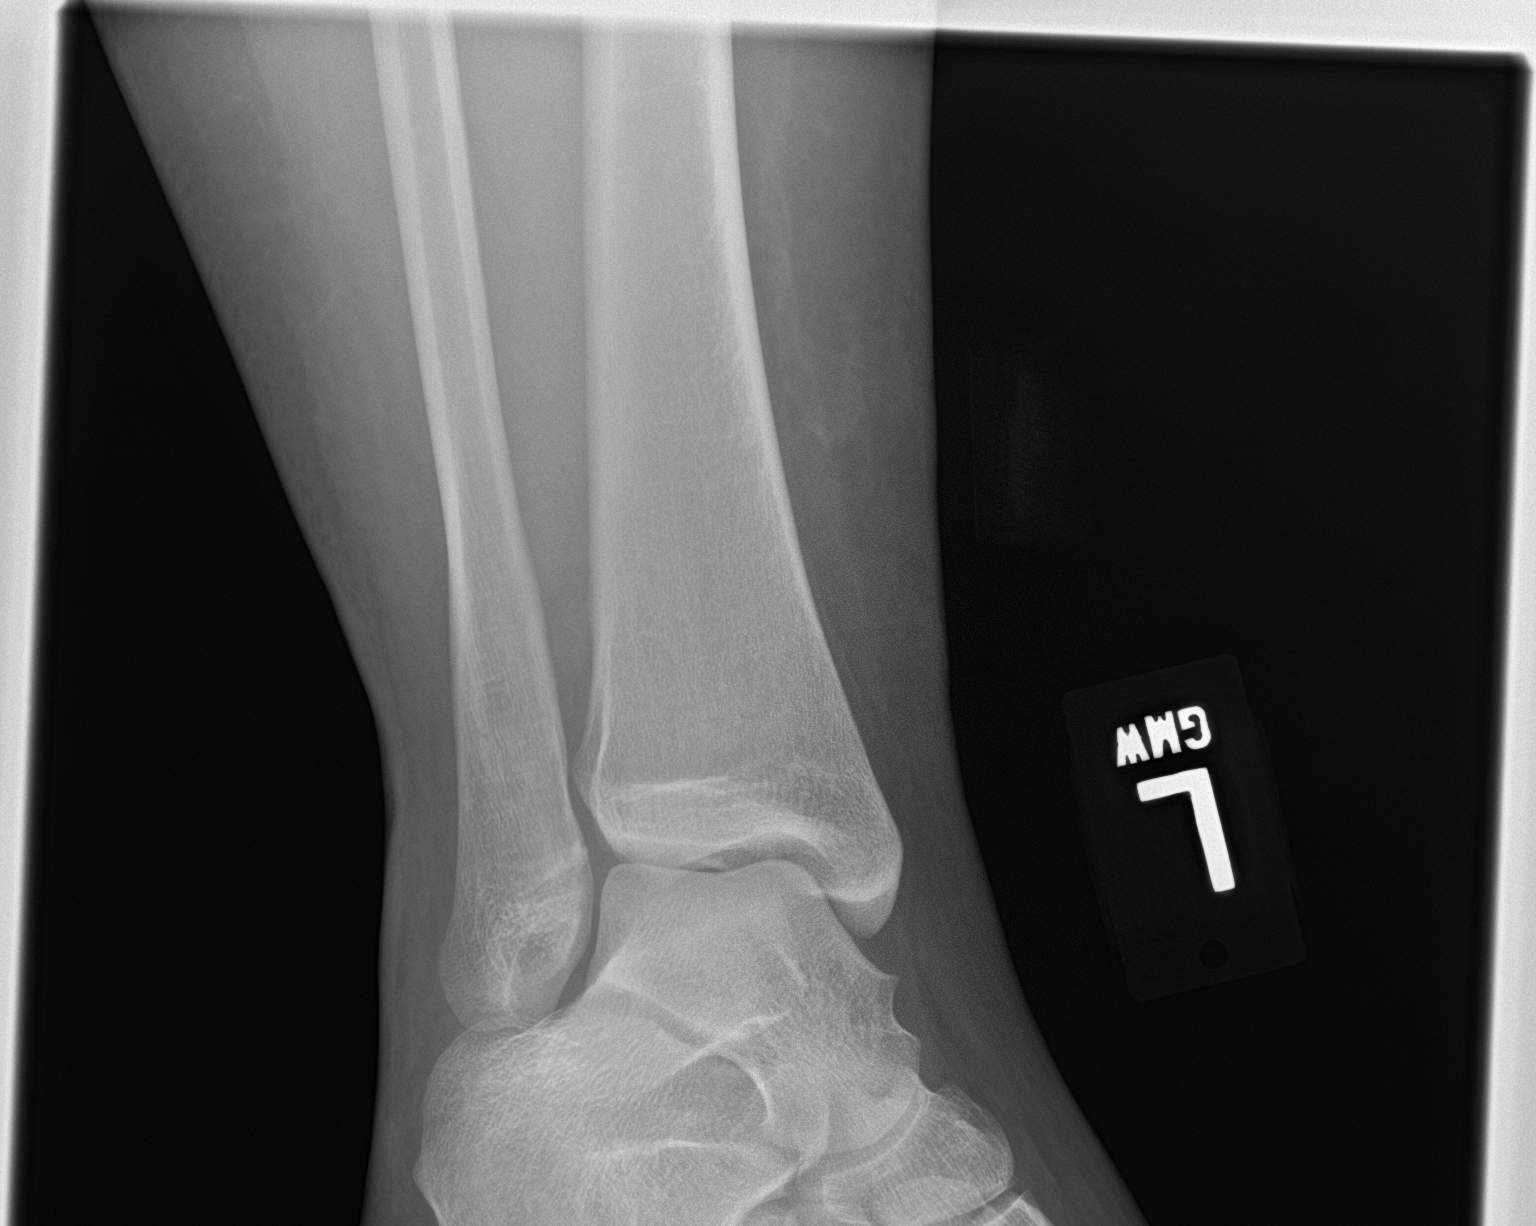

[ankle lat]
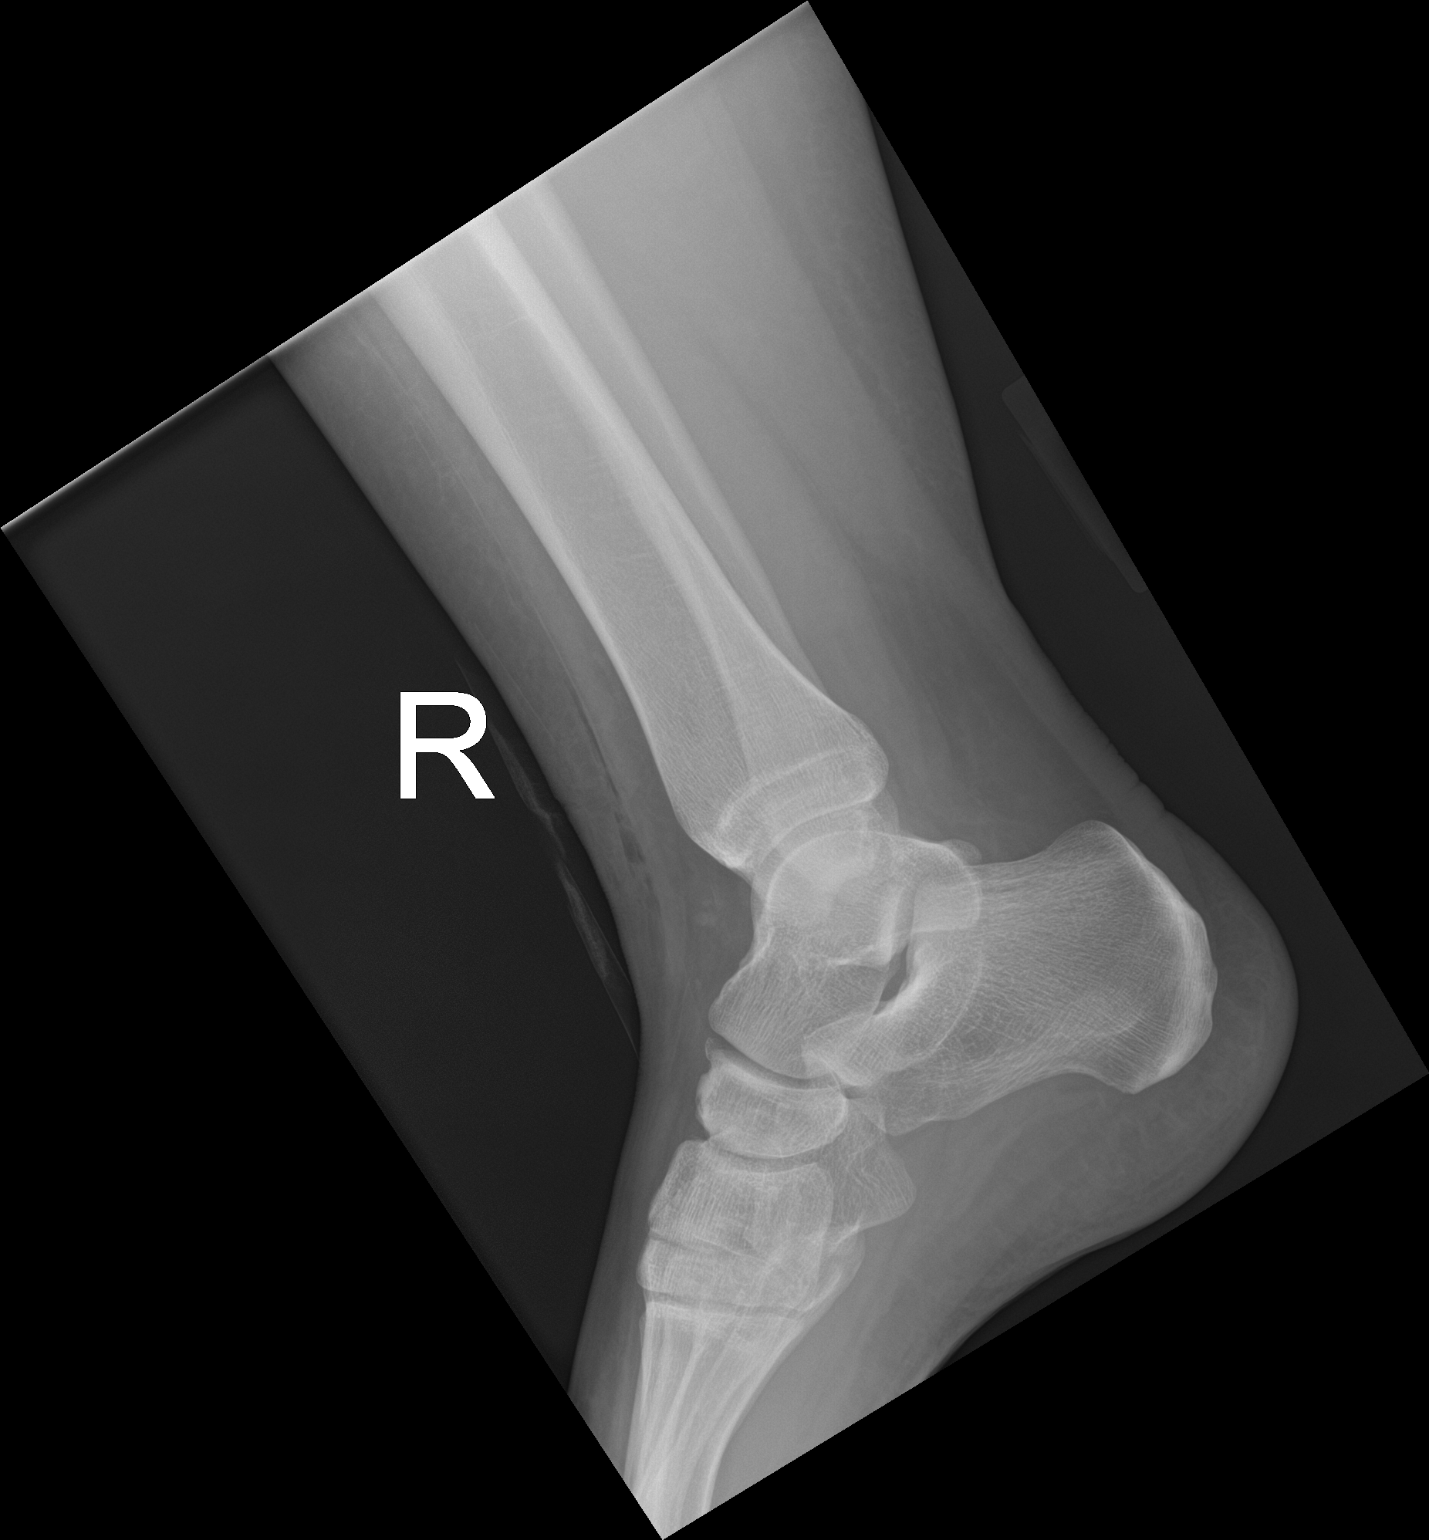

[3 of 3 positions shown; findings below may reference images not displayed]

FINDINGS: No fracture. Ankle mortise is normally space and aligned. There is
soft tissue air along the deep subcutaneous soft tissues of the
anterior ankle. No radiopaque foreign body.
IMPRESSION: No fracture or ankle joint abnormality.

No radiopaque foreign body.

## 2023-08-30 DIAGNOSIS — M722 Plantar fascial fibromatosis: Secondary | ICD-10-CM | POA: Diagnosis not present

## 2023-09-01 DIAGNOSIS — O3680X9 Pregnancy with inconclusive fetal viability, other fetus: Secondary | ICD-10-CM | POA: Diagnosis not present

## 2023-09-01 DIAGNOSIS — Z331 Pregnant state, incidental: Secondary | ICD-10-CM | POA: Diagnosis not present

## 2023-09-01 DIAGNOSIS — Z369 Encounter for antenatal screening, unspecified: Secondary | ICD-10-CM | POA: Diagnosis not present

## 2023-09-01 DIAGNOSIS — Z3481 Encounter for supervision of other normal pregnancy, first trimester: Secondary | ICD-10-CM | POA: Diagnosis not present

## 2023-09-01 DIAGNOSIS — O09511 Supervision of elderly primigravida, first trimester: Secondary | ICD-10-CM | POA: Diagnosis not present

## 2023-09-01 DIAGNOSIS — Z113 Encounter for screening for infections with a predominantly sexual mode of transmission: Secondary | ICD-10-CM | POA: Diagnosis not present

## 2023-09-01 LAB — OB RESULTS CONSOLE HIV ANTIBODY (ROUTINE TESTING): HIV: NONREACTIVE

## 2023-09-01 LAB — OB RESULTS CONSOLE ANTIBODY SCREEN: Antibody Screen: NEGATIVE

## 2023-09-01 LAB — OB RESULTS CONSOLE RPR: RPR: NONREACTIVE

## 2023-09-01 LAB — OB RESULTS CONSOLE ABO/RH: RH Type: POSITIVE

## 2023-09-01 LAB — OB RESULTS CONSOLE HEPATITIS B SURFACE ANTIGEN: Hepatitis B Surface Ag: NEGATIVE

## 2023-09-01 LAB — OB RESULTS CONSOLE HGB/HCT, BLOOD
HCT: 42 — AB (ref 29–41)
Hemoglobin: 13.7

## 2023-09-01 LAB — OB RESULTS CONSOLE RUBELLA ANTIBODY, IGM: Rubella: IMMUNE

## 2023-09-01 LAB — OB RESULTS CONSOLE PLATELET COUNT: Platelets: 335

## 2023-09-01 LAB — HEPATITIS C ANTIBODY: HCV Ab: NEGATIVE

## 2023-09-27 DIAGNOSIS — E559 Vitamin D deficiency, unspecified: Secondary | ICD-10-CM | POA: Diagnosis not present

## 2023-09-27 DIAGNOSIS — Z3A15 15 weeks gestation of pregnancy: Secondary | ICD-10-CM | POA: Diagnosis not present

## 2023-09-27 DIAGNOSIS — O99210 Obesity complicating pregnancy, unspecified trimester: Secondary | ICD-10-CM | POA: Diagnosis not present

## 2023-09-27 DIAGNOSIS — N926 Irregular menstruation, unspecified: Secondary | ICD-10-CM | POA: Diagnosis not present

## 2023-09-28 ENCOUNTER — Other Ambulatory Visit: Payer: Self-pay | Admitting: Obstetrics and Gynecology

## 2023-09-28 DIAGNOSIS — O99210 Obesity complicating pregnancy, unspecified trimester: Secondary | ICD-10-CM

## 2023-10-24 DIAGNOSIS — Z3A18 18 weeks gestation of pregnancy: Secondary | ICD-10-CM | POA: Diagnosis not present

## 2023-10-24 DIAGNOSIS — E559 Vitamin D deficiency, unspecified: Secondary | ICD-10-CM | POA: Diagnosis not present

## 2023-10-24 DIAGNOSIS — O99210 Obesity complicating pregnancy, unspecified trimester: Secondary | ICD-10-CM | POA: Diagnosis not present

## 2023-10-26 DIAGNOSIS — F319 Bipolar disorder, unspecified: Secondary | ICD-10-CM | POA: Diagnosis not present

## 2023-10-26 DIAGNOSIS — F419 Anxiety disorder, unspecified: Secondary | ICD-10-CM | POA: Diagnosis not present

## 2023-10-27 DIAGNOSIS — O99210 Obesity complicating pregnancy, unspecified trimester: Secondary | ICD-10-CM | POA: Insufficient documentation

## 2023-11-01 ENCOUNTER — Telehealth

## 2023-11-07 ENCOUNTER — Other Ambulatory Visit: Payer: Self-pay | Admitting: *Deleted

## 2023-11-07 ENCOUNTER — Ambulatory Visit (HOSPITAL_BASED_OUTPATIENT_CLINIC_OR_DEPARTMENT_OTHER): Admitting: Obstetrics and Gynecology

## 2023-11-07 ENCOUNTER — Ambulatory Visit: Attending: Obstetrics and Gynecology

## 2023-11-07 VITALS — BP 118/70 | HR 103

## 2023-11-07 DIAGNOSIS — E66812 Obesity, class 2: Secondary | ICD-10-CM | POA: Diagnosis not present

## 2023-11-07 DIAGNOSIS — O99210 Obesity complicating pregnancy, unspecified trimester: Secondary | ICD-10-CM | POA: Insufficient documentation

## 2023-11-07 DIAGNOSIS — E669 Obesity, unspecified: Secondary | ICD-10-CM

## 2023-11-07 DIAGNOSIS — Z3A21 21 weeks gestation of pregnancy: Secondary | ICD-10-CM

## 2023-11-07 DIAGNOSIS — O99212 Obesity complicating pregnancy, second trimester: Secondary | ICD-10-CM

## 2023-11-07 DIAGNOSIS — O283 Abnormal ultrasonic finding on antenatal screening of mother: Secondary | ICD-10-CM

## 2023-11-07 DIAGNOSIS — O35EXX0 Maternal care for other (suspected) fetal abnormality and damage, fetal genitourinary anomalies, not applicable or unspecified: Secondary | ICD-10-CM

## 2023-11-07 DIAGNOSIS — O35BXX0 Maternal care for other (suspected) fetal abnormality and damage, fetal cardiac anomalies, not applicable or unspecified: Secondary | ICD-10-CM | POA: Diagnosis not present

## 2023-11-07 DIAGNOSIS — Z362 Encounter for other antenatal screening follow-up: Secondary | ICD-10-CM

## 2023-11-07 NOTE — Progress Notes (Signed)
 Maternal-Fetal Medicine Consultation Name: Kelsey Shaffer MRN: [id number]  G1 P0 at 21w gestation.  [facility]  She is interested in water birth.  Patient is here for fetal anatomy scan. On cell-free fetal DNA screening, the risks of aneuploidies are not increased.  Pregravid BMI 41. Patient takes lurasidone (Latuda) 20 mg daily for bipolar disorder. Ultrasound We performed fetal anatomy scan.  An echogenic intracardiac focus is seen.  No other makers of aneuploidies or fetal structural defects are seen. Fetal biometry is consistent with her previously-established dates. Amniotic fluid is normal and good fetal activity is seen. Patient understands the limitations of ultrasound in detecting fetal anomalies.   Echogenic intracardiac focus (EIF) EIF is present in about 3% to 4% of normal fetuses and in some fetuses with Down syndrome.  Given that she has low risk for fetal Down syndrome on cell-free fetal DNA screening, this should be considered a normal variant.  I did not recommend amniocentesis for this finding.  I counseled the patient that cell free fetal DNA screening has a greater detection rate for Down syndrome. However, only amniocentesis will give a definitive result on the fetal karyotype.  I explained amniocentesis procedure and possible complication of miscarriage (1 and 500 procedures). Patient opted not to have amniocentesis. Lurasidone [name]) It is an antipsychotic medication given and patient with schizophrenia or bipolar disorder.  Not enough information is available on the effect of this drug in human pregnancies.  However, animal studies showed no increased risk of congenital malformations.  Use of this medication in third trimester can lead to extraparametal symptoms and withdrawal symptoms in the newborn.  Pregravid BMI Grade 3 obesity is independently associated with increased risk of stillbirth (2.5- to 3-fold),  but the absolute risk is very small.  I discussed protocol of weekly antenatal testing from [redacted] weeks gestation until delivery. Maternal obesity imposes limitations on the resolution of images and fetal anomalies may be missed. Recommendations -Appointment was made for her to return in 5 weeks for fetal growth assessment. -Fetal growth assessments every 4 weeks till delivery. -Weekly BPP from [redacted] weeks gestation until delivery.  Consultation including face-to-face (more than 50%) counseling 30 minutes.

## 2023-11-11 DIAGNOSIS — F419 Anxiety disorder, unspecified: Secondary | ICD-10-CM | POA: Diagnosis not present

## 2023-11-11 DIAGNOSIS — F319 Bipolar disorder, unspecified: Secondary | ICD-10-CM | POA: Diagnosis not present

## 2023-11-23 DIAGNOSIS — F419 Anxiety disorder, unspecified: Secondary | ICD-10-CM | POA: Diagnosis not present

## 2023-11-23 DIAGNOSIS — F319 Bipolar disorder, unspecified: Secondary | ICD-10-CM | POA: Diagnosis not present

## 2023-11-29 DIAGNOSIS — F319 Bipolar disorder, unspecified: Secondary | ICD-10-CM | POA: Diagnosis not present

## 2023-11-29 DIAGNOSIS — F419 Anxiety disorder, unspecified: Secondary | ICD-10-CM | POA: Diagnosis not present

## 2023-11-30 ENCOUNTER — Other Ambulatory Visit: Payer: Self-pay

## 2023-11-30 ENCOUNTER — Encounter: Payer: Self-pay | Admitting: Certified Nurse Midwife

## 2023-11-30 ENCOUNTER — Ambulatory Visit: Admitting: Certified Nurse Midwife

## 2023-11-30 VITALS — BP 136/86 | HR 104 | Wt 263.0 lb

## 2023-11-30 DIAGNOSIS — Z3A24 24 weeks gestation of pregnancy: Secondary | ICD-10-CM | POA: Diagnosis not present

## 2023-11-30 DIAGNOSIS — F3160 Bipolar disorder, current episode mixed, unspecified: Secondary | ICD-10-CM

## 2023-11-30 DIAGNOSIS — Z3490 Encounter for supervision of normal pregnancy, unspecified, unspecified trimester: Secondary | ICD-10-CM | POA: Insufficient documentation

## 2023-11-30 DIAGNOSIS — O0932 Supervision of pregnancy with insufficient antenatal care, second trimester: Secondary | ICD-10-CM

## 2023-11-30 DIAGNOSIS — O0990 Supervision of high risk pregnancy, unspecified, unspecified trimester: Secondary | ICD-10-CM | POA: Insufficient documentation

## 2023-11-30 DIAGNOSIS — Z3402 Encounter for supervision of normal first pregnancy, second trimester: Secondary | ICD-10-CM

## 2023-11-30 DIAGNOSIS — Z3492 Encounter for supervision of normal pregnancy, unspecified, second trimester: Secondary | ICD-10-CM

## 2023-11-30 DIAGNOSIS — F840 Autistic disorder: Secondary | ICD-10-CM | POA: Diagnosis not present

## 2023-12-01 DIAGNOSIS — F840 Autistic disorder: Secondary | ICD-10-CM | POA: Insufficient documentation

## 2023-12-01 NOTE — Progress Notes (Signed)
 History:   Kelsey Shaffer is a 28 y.o. G1P0 at [redacted]w[redacted]d by LMP, early ultrasound being seen today for her first obstetrical visit.  Her obstetrical history is significant for obesity. Patient does intend to breast feed. Pregnancy history fully reviewed.  Patient reports no complaints.      HISTORY: OB History  Gravida Para Term Preterm AB Living  1 0 0 0 0 0  SAB IAB Ectopic Multiple Live Births  0 0 0 0 0    # Outcome Date GA Lbr Len/2nd Weight Sex Type Anes PTL Lv  1 Current             Last pap smear was done 11/27/19 and was normal  Past Medical History:  Diagnosis Date   Anemia    Asthma    Bipolar affective (HCC)    Panic anxiety syndrome    PTSD (post-traumatic stress disorder)    Vitamin D deficiency    Past Surgical History:  Procedure Laterality Date   WISDOM TOOTH EXTRACTION     Family History  Problem Relation Age of Onset   Cancer Maternal Grandfather    Social History   Tobacco Use   Smoking status: Light Smoker  Vaping Use   Vaping status: Former   Substances: Nicotine  Substance Use Topics   Alcohol use: Not Currently    Comment: social   Drug use: No   Allergies  Allergen Reactions   Withania Somnifera Hives   Current Outpatient Medications on File Prior to Visit  Medication Sig Dispense Refill   lurasidone (LATUDA) 20 MG TABS tablet Take by mouth.     Prenatal Vit-Fe Fumarate-FA (PRENATAL MULTIVITAMIN) TABS tablet Take 1 tablet by mouth daily at 12 noon.     VITAMIN D PO Take by mouth.     No current facility-administered medications on file prior to visit.    Review of Systems Pertinent items noted in HPI and remainder of comprehensive ROS otherwise negative. Physical Exam:   Vitals:   11/30/23 1425  BP: 136/86  Pulse: (!) 104  Weight: 263 lb (119.3 kg)   Fetal Heart Rate (bpm): 150  Constitutional: Well-developed, well-nourished pregnant female in no acute distress.  HEENT: PERRLA Skin: normal color and turgor, evidence  of skin eruptions in various stages of healing Cardiovascular: normal rate & rhythm, warm and well perfused Respiratory: normal effort, no problems with respiration noted GI: Abd soft, non-distended MS: Extremities nontender, no edema, normal ROM Neurologic: Alert and oriented x 4.  Pelvic: Exam deferred  Assessment:   Pregnancy: G1P0 Patient Active Problem List   Diagnosis Date Noted   Supervision of low-risk pregnancy 11/30/2023   Obesity affecting pregnancy, antepartum 10/27/2023   Bipolar 1 disorder, mixed (HCC) 10/30/2013   Plan:   1. Encounter for supervision of low-risk pregnancy in second trimester (Primary) - Doing well, feeling fetal movement - Transferred to our practice for CNM delivery and waterbirth - Pt is interested in waterbirth.  No contraindications at this time per chart review/patient assessment.   - Pt has taken the class, will see CNMs for most visits in the office.  - Discussed waterbirth as option for low-risk pregnancy.  Reviewed conditions that may arise during pregnancy that will risk pt out of waterbirth including hypertension, diabetes, fetal growth restriction <10%ile, etc.  2. [redacted] weeks gestation of pregnancy - Routine OB care including anticipatory guidance re GTT at next visit - Pt wants to avoid food dyes, advised our glucola has no dye but  she is also welcome to use the FreshTest. Advised if she chooses this method, to purchase the 75mg  premix and bring with her to her next visit  3. Bipolar 1 disorder, mixed (HCC) - Stable on lurasidone, this is the most regulated her mood has felt (per her records, she has tried lamotrigine, buspar, sertraline, fluoxetine and aripiprazole in the past without success).   4. Autism spectrum - Has not been screened, but shows clear symptoms of concurrent hyperactive ADHD   5. Initial obstetric visit in second trimester - Initial labs drawn. - Continue prenatal vitamins. - Problem list reviewed and updated. -  Genetic Screening discussed, First trimester screen, Quad screen, and NIPS: results reviewed. - Ultrasound discussed; fetal anatomic survey: results reviewed. - Anticipatory guidance about prenatal visits given including labs, ultrasounds, and testing. - Discussed usage of Babyscripts and virtual visits as additional source of managing and completing prenatal visits in midst of coronavirus and pandemic.   - Encouraged to complete MyChart Registration for her ability to review results, send requests, and have questions addressed.  - The nature of [facility] with multiple MDs and Advanced Practice Providers was explained to patient; also emphasized that residents, students are part of our team. - Routine obstetric precautions reviewed. Encouraged to seek out care at office or emergency room [facility] preferred) for urgent and/or emergent concerns.  Return in about 4 weeks (around 12/28/2023) for IN-PERSON, LOB/GTT.    Future Appointments  Date Time Provider Department Center  12/07/2023 10:05 AM Severa Rock HERO, FNP [facility] None  12/14/2023  1:15 PM [facility] PROVIDER 1 [facility]  12/14/2023  1:30 PM [facility] US6 [facility]  12/28/2023  8:40 AM [facility]  12/28/2023 10:35 AM Vannie Cornell JONELLE EDDY [facility]  01/11/2024 11:00 AM [facility] PROVIDER 1 [facility]  01/11/2024 11:30 AM [facility] US5 [facility]   Cornell Vannie, MSN, CNM, IBCLC Certified Nurse Midwife, [facility]

## 2023-12-07 ENCOUNTER — Encounter: Payer: Self-pay | Admitting: Family Medicine

## 2023-12-07 ENCOUNTER — Ambulatory Visit: Payer: Self-pay | Admitting: Family Medicine

## 2023-12-07 VITALS — BP 131/65 | HR 104 | Temp 97.8°F | Ht 65.0 in | Wt 265.2 lb

## 2023-12-07 DIAGNOSIS — M79671 Pain in right foot: Secondary | ICD-10-CM

## 2023-12-07 DIAGNOSIS — F3160 Bipolar disorder, current episode mixed, unspecified: Secondary | ICD-10-CM

## 2023-12-07 DIAGNOSIS — M25512 Pain in left shoulder: Secondary | ICD-10-CM

## 2023-12-07 DIAGNOSIS — Z3A25 25 weeks gestation of pregnancy: Secondary | ICD-10-CM

## 2023-12-07 DIAGNOSIS — J4520 Mild intermittent asthma, uncomplicated: Secondary | ICD-10-CM | POA: Diagnosis not present

## 2023-12-07 DIAGNOSIS — M545 Low back pain, unspecified: Secondary | ICD-10-CM

## 2023-12-07 DIAGNOSIS — M79672 Pain in left foot: Secondary | ICD-10-CM | POA: Diagnosis not present

## 2023-12-07 DIAGNOSIS — G8929 Other chronic pain: Secondary | ICD-10-CM | POA: Diagnosis not present

## 2023-12-07 MED ORDER — ALBUTEROL SULFATE HFA 108 (90 BASE) MCG/ACT IN AERS: 2.0000 | INHALATION_SPRAY | Freq: Four times a day (QID) | RESPIRATORY_TRACT | 11 refills | Status: AC | PRN

## 2023-12-07 NOTE — Progress Notes (Signed)
 Subjective:  Patient ID: Kelsey Shaffer, female    DOB: [birth year], 28 y.o.   MRN: [id number]  Patient Care Team: Severa Rock HERO, FNP as PCP - General (Family Medicine)   Chief Complaint:  New Patient (Initial Visit) [facility]) and Establish Care   HPI: Kelsey Shaffer is a 28 y.o. female presenting on 12/07/2023 for New Patient (Initial Visit) [facility]) and Establish Care  Kelsey Shaffer is a 28 year old who presents to establish care and discuss ongoing health issues.  She is currently 25 weeks and 2 days pregnant, with a due date of December 1st. She is under the care of an OB at [facility] and has an upcoming appointment on September 10th, which includes an ultrasound. She is taking prenatal vitamins and vitamin D.  She has a history of bipolar disorder diagnosed over ten years ago and is currently taking Latuda for mood stabilization, which she resumed after experiencing a decline in mental health during pregnancy. She previously used lamotrigine but switched due to insurance coverage issues. She has been on Latuda for over a month without issues and is under the care of a psychiatrist and a therapist at [facility].  She experiences significant pain in her left foot after one to two hours of standing, impacting her ability to work. She has tried supportive footwear and receives foot massages for relief. She also has a history of a left shoulder injury diagnosed as bursitis, which was not treated due to loss of insurance. She performs physical therapy exercises at home but still experiences limited range of motion. Additionally, she has a history of a lower back injury from age 13, which causes ongoing pain, exacerbated by pregnancy.  She was previously diagnosed with celiac disease based on blood tests but has reintroduced gluten into her diet without significant issues for the past year and a half. She attributes this  improvement to a diet of fresh, home-cooked foods.  She has a history of asthma, which is triggered by exercise and humidity. She manages it by avoiding triggers and has not had an asthma attack recently. Her previous albuterol  inhaler is expired.          Relevant past medical, surgical, family, and social history reviewed and updated as indicated.  Allergies and medications reviewed and updated. Data reviewed: Chart in Epic.   Past Medical History:  Diagnosis Date   Anemia    Asthma    Bipolar affective (HCC)    Panic anxiety syndrome    PTSD (post-traumatic stress disorder)    Vitamin D deficiency     Past Surgical History:  Procedure Laterality Date   WISDOM TOOTH EXTRACTION      Social History   Socioeconomic History   Marital status: Significant Other    Spouse name: Not on file   Number of children: Not on file   Years of education: Not on file   Highest education level: Not on file  Occupational History   Not on file  Tobacco Use   Smoking status: Never   Smokeless tobacco: Never  Vaping Use   Vaping status: Former   Substances: Nicotine  Substance and Sexual Activity   Alcohol use: Not Currently    Comment: social   Drug use: No   Sexual activity: Yes  Other Topics Concern   Not on file  Social History Narrative   Not on file   Social Drivers of Health  Financial Resource Strain: Not on file  Food Insecurity: No Food Insecurity (11/30/2023)   Hunger Vital Sign    Worried About Running Out of Food in the Last Year: Never true    Ran Out of Food in the Last Year: Never true  Transportation Needs: No Transportation Needs (11/30/2023)   PRAPARE - Administrator, Civil Service (Medical): No    Lack of Transportation (Non-Medical): No  Physical Activity: Not on file  Stress: Not on file  Social Connections: Not on file  Intimate Partner Violence: Not on file    Outpatient Encounter Medications as of 12/07/2023  Medication Sig    albuterol  (VENTOLIN  HFA) 108 (90 Base) MCG/ACT inhaler Inhale 2 puffs into the lungs every 6 (six) hours as needed for wheezing or shortness of breath.   lurasidone (LATUDA) 20 MG TABS tablet Take by mouth.   Prenatal Vit-Fe Fumarate-FA (PRENATAL MULTIVITAMIN) TABS tablet Take 1 tablet by mouth daily at 12 noon.   VITAMIN D PO Take by mouth.   No facility-administered encounter medications on file as of 12/07/2023.    Allergies  Allergen Reactions   Other Other (See Comments)   Withania Somnifera Hives    Pertinent ROS per HPI, otherwise unremarkable      Objective:  BP 131/65   Pulse (!) 104   Temp 97.8 F (36.6 C)   Ht 5' 5 (1.651 m)   Wt 265 lb 3.2 oz (120.3 kg)   SpO2 97%   BMI 44.13 kg/m    Wt Readings from Last 3 Encounters:  12/07/23 265 lb 3.2 oz (120.3 kg)  11/30/23 263 lb (119.3 kg)  02/02/15 195 lb (88.5 kg)    Physical Exam Vitals and nursing note reviewed.  Constitutional:      Appearance: She is morbidly obese.  HENT:     Head: Normocephalic and atraumatic.     Nose: Nose normal.     Mouth/Throat:     Mouth: Mucous membranes are moist.     Dentition: Abnormal dentition. Dental caries present.  Eyes:     Conjunctiva/sclera: Conjunctivae normal.     Pupils: Pupils are equal, round, and reactive to light.  Cardiovascular:     Rate and Rhythm: Normal rate.  Pulmonary:     Effort: Pulmonary effort is normal.  Musculoskeletal:     Right shoulder: Normal.     Left shoulder: Decreased range of motion.     Cervical back: Normal and neck supple.     Right lower leg: No edema.     Left lower leg: No edema.  Skin:    General: Skin is warm and dry.     Capillary Refill: Capillary refill takes less than 2 seconds.  Neurological:     General: No focal deficit present.     Mental Status: She is alert and oriented to person, place, and time.  Psychiatric:        Attention and Perception: Attention normal.        Mood and Affect: Mood is anxious.         Speech: Speech normal.        Behavior: Behavior is cooperative.        Thought Content: Thought content normal.        Cognition and Memory: Cognition and memory normal.     Results for orders placed or performed in visit on 11/30/23  OB RESULTS CONSOLE RPR   Collection Time: 09/01/23 12:00 AM  Result Value Ref Range  RPR Nonreactive   OB RESULTS CONSOLE HIV antibody   Collection Time: 09/01/23 12:00 AM  Result Value Ref Range   HIV Non-reactive   OB RESULTS CONSOLE Rubella Antibody   Collection Time: 09/01/23 12:00 AM  Result Value Ref Range   Rubella Immune   OB RESULTS CONSOLE Hepatitis B surface antigen   Collection Time: 09/01/23 12:00 AM  Result Value Ref Range   Hepatitis B Surface Ag Negative   OB RESULTS CONSOLE Hemoglobin and hematocrit, blood   Collection Time: 09/01/23 12:00 AM  Result Value Ref Range   Hemoglobin 13.7    HCT 42 (A) 29 - 41  OB RESULTS CONSOLE PLATELET COUNT   Collection Time: 09/01/23 12:00 AM  Result Value Ref Range   Platelets 335   Hepatitis C antibody   Collection Time: 09/01/23 12:00 AM  Result Value Ref Range   HCV Ab Negative   OB RESULTS CONSOLE ABO/Rh   Collection Time: 09/01/23 12:00 AM  Result Value Ref Range   RH Type  Positive    ABO Grouping O   OB RESULTS CONSOLE Antibody Screen   Collection Time: 09/01/23 12:00 AM  Result Value Ref Range   Antibody Screen Negative        Pertinent labs & imaging results that were available during my care of the patient were reviewed by me and considered in my medical decision making.  Assessment & Plan:  Kelsey Shaffer was seen today for new patient (initial visit) and establish care.  Diagnoses and all orders for this visit:  Bipolar 1 disorder, mixed (HCC) Followed by psychiatry   [redacted] weeks gestation of pregnancy Followed by OB  Chronic foot pain, left -     Ambulatory referral to Physical Therapy  Chronic left shoulder pain -     Ambulatory referral to Physical  Therapy  Chronic bilateral low back pain without sciatica -     Ambulatory referral to Physical Therapy  Mild intermittent asthma without complication -     albuterol  (VENTOLIN  HFA) 108 (90 Base) MCG/ACT inhaler; Inhale 2 puffs into the lungs every 6 (six) hours as needed for wheezing or shortness of breath.   Supervision of normal pregnancy, second trimester 25 weeks and 2 days pregnant. Regular OB care with Tyronne Finder at [facility].  - Continue regular OB care with Tyronne Finder - Review OB labs and add any necessary tests - Schedule postpartum visit in February after completion of postpartum care  Left foot pain and plantar fasciitis Chronic left foot pain, possibly plantar fasciitis, causing significant discomfort after 1-2 hours of standing. Previous podiatrist visit was unhelpful. Pain management is challenging and affects daily activities and work capability. - Recommend Tylenol  for pain management - Advise use of ice and heat therapy - Encourage foot massages for relief - Referral to physical therapy for further evaluation and management  Right foot plantar fasciitis Suspected plantar fasciitis in the right foot. Pain is still present. 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 Limited range of motion and pain persist, affecting exercise capability. - Referral to physical therapy for further evaluation and management - Recommend Tylenol  for pain management - Advise use of heat therapy  Chronic low back pain Chronic low back pain since a teenage injury. Pain has increased with pregnancy weight gain. - Referral to physical  therapy for further evaluation and management - Recommend Tylenol  for pain management - Advise use of heat therapy  Bipolar disorder Diagnosed over 10 years ago.  Currently on Latuda for mood stabilization, prescribed by psychiatrist at [facility]. OB is aware and has approved Latuda during pregnancy. - Continue Latuda as prescribed by psychiatrist  Intermittent asthma, uncomplicated Asthma triggered by exercise and humidity. No recent asthma attacks. Previous albuterol  inhaler expired. - Prescribe albuterol  inhaler for emergency use          Continue all other maintenance medications.  Follow up plan: Return in 6 months (on 06/08/2024), or if symptoms worsen or fail to improve, for Annual Physical.   Continue healthy lifestyle choices, including diet (rich in fruits, vegetables, and lean proteins, and low in salt and simple carbohydrates) and exercise (at least 30 minutes of moderate physical activity daily).  Educational handout given for second trimester pregnancy   The above assessment and management plan was discussed with the patient. The patient verbalized understanding of and has agreed to the management plan. Patient is aware to call the clinic if they develop any new symptoms or if symptoms persist or worsen. Patient is aware when to return to the clinic for a follow-up visit. Patient educated on when it is appropriate to go to the emergency department.   Rosaline Bruns, FNP-C [facility] [phone]

## 2023-12-14 ENCOUNTER — Other Ambulatory Visit: Payer: Self-pay | Admitting: Obstetrics and Gynecology

## 2023-12-14 ENCOUNTER — Ambulatory Visit: Attending: Obstetrics and Gynecology | Admitting: Maternal & Fetal Medicine

## 2023-12-14 ENCOUNTER — Ambulatory Visit

## 2023-12-14 ENCOUNTER — Other Ambulatory Visit: Payer: Self-pay | Admitting: *Deleted

## 2023-12-14 ENCOUNTER — Ambulatory Visit: Admitting: *Deleted

## 2023-12-14 VITALS — BP 151/95 | HR 92

## 2023-12-14 DIAGNOSIS — Z3A26 26 weeks gestation of pregnancy: Secondary | ICD-10-CM | POA: Diagnosis not present

## 2023-12-14 DIAGNOSIS — O365920 Maternal care for other known or suspected poor fetal growth, second trimester, not applicable or unspecified: Secondary | ICD-10-CM | POA: Diagnosis not present

## 2023-12-14 DIAGNOSIS — O283 Abnormal ultrasonic finding on antenatal screening of mother: Secondary | ICD-10-CM

## 2023-12-14 DIAGNOSIS — O99210 Obesity complicating pregnancy, unspecified trimester: Secondary | ICD-10-CM

## 2023-12-14 DIAGNOSIS — Z362 Encounter for other antenatal screening follow-up: Secondary | ICD-10-CM

## 2023-12-14 DIAGNOSIS — O289 Unspecified abnormal findings on antenatal screening of mother: Secondary | ICD-10-CM

## 2023-12-14 DIAGNOSIS — O365990 Maternal care for other known or suspected poor fetal growth, unspecified trimester, not applicable or unspecified: Secondary | ICD-10-CM

## 2023-12-14 DIAGNOSIS — Z364 Encounter for antenatal screening for fetal growth retardation: Secondary | ICD-10-CM

## 2023-12-14 DIAGNOSIS — O99212 Obesity complicating pregnancy, second trimester: Secondary | ICD-10-CM

## 2023-12-14 DIAGNOSIS — O35BXX0 Maternal care for other (suspected) fetal abnormality and damage, fetal cardiac anomalies, not applicable or unspecified: Secondary | ICD-10-CM

## 2023-12-14 DIAGNOSIS — E669 Obesity, unspecified: Secondary | ICD-10-CM

## 2023-12-14 DIAGNOSIS — O321XX0 Maternal care for breech presentation, not applicable or unspecified: Secondary | ICD-10-CM | POA: Diagnosis not present

## 2023-12-14 NOTE — Progress Notes (Signed)
 MFM Consultation   Ms. Mcduffee is a 28 yo G1P0 at 26w 2 she is here for  follow up growth due to elevated BMI. Today there is a new finding of fetal growth restriction with elevated dopplers  Normal interval growth with measurements consistent with FGR at 5th% [facility] 12th.) Good fetal movement and amniotic fluid volume  The UAD are elevated without evidence of AEDF or REDF. NST reactive   I discussed today's visit with a diagnosis of IUGR. I explained that the etiology includes placental insufficiency, chronic disease, infection, aneuploidy and other genetic syndromes. She has a low risk NIPS and neg horizon. She has no additional risk factors for chronic disease. At this time I explained the diagnosis, evaluation and management to include serial fetal growth and weekly antenatal testing to include UA Dopplers.   At this time we would recommend delivery at 37 weeks given elevated UAD.  She could not return on Friday however, she will return on Tuesday for testing ( Monday is Labor Day)  Repeat UAD/NST/ AFI in week. - consider 2x weekly testing if UAD become intermittent absent.  Repeat growth in 3 weeks.   I spent 30 minutes with > 50% in face to face consultation and care coordination.  Nathanel DOROTHA Fetters, MD

## 2023-12-14 NOTE — Procedures (Signed)
 Kelsey Shaffer 1995-07-03 [redacted]w[redacted]d  Fetus A Non-Stress Test Interpretation for 12/14/23-NST with UAD  Indication: abn fet u/s, elevated dopplers  Fetal Heart Rate A Mode: External Baseline Rate (A): 140 bpm Variability: Moderate Accelerations: 15 x 15 Decelerations: None Multiple birth?: No  Uterine Activity Mode: Toco Contraction Frequency (min): none Resting Tone Palpated: Relaxed  Interpretation (Fetal Testing) Nonstress Test Interpretation: Reactive Comments: Tracing reviewed by Dr. Kizzie

## 2023-12-19 DIAGNOSIS — R1010 Upper abdominal pain, unspecified: Secondary | ICD-10-CM | POA: Diagnosis not present

## 2023-12-19 DIAGNOSIS — O99891 Other specified diseases and conditions complicating pregnancy: Secondary | ICD-10-CM | POA: Diagnosis not present

## 2023-12-19 DIAGNOSIS — R0602 Shortness of breath: Secondary | ICD-10-CM | POA: Diagnosis not present

## 2023-12-19 DIAGNOSIS — Z720 Tobacco use: Secondary | ICD-10-CM | POA: Diagnosis not present

## 2023-12-19 DIAGNOSIS — O26892 Other specified pregnancy related conditions, second trimester: Secondary | ICD-10-CM | POA: Diagnosis not present

## 2023-12-19 DIAGNOSIS — Z3A27 27 weeks gestation of pregnancy: Secondary | ICD-10-CM | POA: Diagnosis not present

## 2023-12-19 DIAGNOSIS — O99342 Other mental disorders complicating pregnancy, second trimester: Secondary | ICD-10-CM | POA: Diagnosis not present

## 2023-12-19 DIAGNOSIS — R1013 Epigastric pain: Secondary | ICD-10-CM | POA: Diagnosis not present

## 2023-12-19 DIAGNOSIS — F32A Depression, unspecified: Secondary | ICD-10-CM | POA: Diagnosis not present

## 2023-12-19 DIAGNOSIS — R1011 Right upper quadrant pain: Secondary | ICD-10-CM | POA: Diagnosis not present

## 2023-12-20 ENCOUNTER — Ambulatory Visit (HOSPITAL_BASED_OUTPATIENT_CLINIC_OR_DEPARTMENT_OTHER)

## 2023-12-20 ENCOUNTER — Encounter: Payer: Self-pay | Admitting: Certified Nurse Midwife

## 2023-12-20 ENCOUNTER — Other Ambulatory Visit: Payer: Self-pay

## 2023-12-20 ENCOUNTER — Ambulatory Visit: Attending: Maternal & Fetal Medicine

## 2023-12-20 ENCOUNTER — Ambulatory Visit (HOSPITAL_BASED_OUTPATIENT_CLINIC_OR_DEPARTMENT_OTHER): Admitting: Obstetrics and Gynecology

## 2023-12-20 ENCOUNTER — Ambulatory Visit

## 2023-12-20 VITALS — BP 134/95

## 2023-12-20 DIAGNOSIS — E669 Obesity, unspecified: Secondary | ICD-10-CM

## 2023-12-20 DIAGNOSIS — O99210 Obesity complicating pregnancy, unspecified trimester: Secondary | ICD-10-CM | POA: Insufficient documentation

## 2023-12-20 DIAGNOSIS — Z3492 Encounter for supervision of normal pregnancy, unspecified, second trimester: Secondary | ICD-10-CM | POA: Insufficient documentation

## 2023-12-20 DIAGNOSIS — O321XX0 Maternal care for breech presentation, not applicable or unspecified: Secondary | ICD-10-CM | POA: Diagnosis not present

## 2023-12-20 DIAGNOSIS — Z3A27 27 weeks gestation of pregnancy: Secondary | ICD-10-CM

## 2023-12-20 DIAGNOSIS — O365930 Maternal care for other known or suspected poor fetal growth, third trimester, not applicable or unspecified: Secondary | ICD-10-CM | POA: Diagnosis not present

## 2023-12-20 DIAGNOSIS — O358XX0 Maternal care for other (suspected) fetal abnormality and damage, not applicable or unspecified: Secondary | ICD-10-CM | POA: Diagnosis not present

## 2023-12-20 DIAGNOSIS — Z362 Encounter for other antenatal screening follow-up: Secondary | ICD-10-CM | POA: Diagnosis not present

## 2023-12-20 DIAGNOSIS — O365920 Maternal care for other known or suspected poor fetal growth, second trimester, not applicable or unspecified: Secondary | ICD-10-CM | POA: Diagnosis not present

## 2023-12-20 DIAGNOSIS — O99213 Obesity complicating pregnancy, third trimester: Secondary | ICD-10-CM | POA: Diagnosis not present

## 2023-12-20 DIAGNOSIS — O365990 Maternal care for other known or suspected poor fetal growth, unspecified trimester, not applicable or unspecified: Secondary | ICD-10-CM | POA: Insufficient documentation

## 2023-12-20 DIAGNOSIS — O99212 Obesity complicating pregnancy, second trimester: Secondary | ICD-10-CM

## 2023-12-20 MED ORDER — BLOOD PRESSURE KIT DEVI: 1.0000 | 0 refills | Status: AC | PRN

## 2023-12-20 NOTE — Progress Notes (Addendum)
 Maternal-Fetal Medicine Consultation Name: Kelsey Shaffer MRN: [id number]  G1 P0 at 27w 1d gestation. Fetal growth restriction.  On ultrasound performed last week, the estimated fetal weight close at the 5th percentile and the abdominal circumference measurement at the 12th percentile. Blood pressure today at our office is 134/95 mmHg.  Patient does not have signs and symptoms of severe features of preeclampsia.  Ultrasound Amniotic fluid is normal and good fetal activity is seen.  Umbilical artery Doppler showed increased S/D ratio.  NST is reactive.  Fetal growth restriction I discussed the significance of umbilical artery Doppler study that shows placental insufficiency.  I discussed the progression of Doppler studies in some cases to absent- or reversed-and-diastolic flow based on the severity of placental insufficiency.  If Doppler studies worsen, we will recommend antenatal corticosteroids.  R/o Gestational hypertension/preeclampsia.   I encouraged the patient to get blood pressure cuff at home and check her blood pressures. Fetal growth restriction can precede development of gestational hypertension/preeclampsia in some cases.  Primigravida is associated with higher incidence of preeclampsia (7% to 10%).  Recommendations -Continue weekly antenatal testing till delivery. - Timing of delivery will be discussed after future fetal growth assessments.  Consultation including face-to-face (more than 50%) counseling 20 minutes.

## 2023-12-20 NOTE — Procedures (Signed)
 Angell Pincock 03/31/1996 [redacted]w[redacted]d  Fetus A Non-Stress Test Interpretation for 12/20/23  Indication: IUGR  Fetal Heart Rate A Mode: External Baseline Rate (A): 130 bpm Variability: Moderate Accelerations: 15 x 15, 10 x 10 Decelerations: None Multiple birth?: No  Uterine Activity Mode: Toco, Palpation Contraction Frequency (min): none noted Resting Tone Palpated: Relaxed  Interpretation (Fetal Testing) Nonstress Test Interpretation: Reactive Comments: Reviewed with Dr. Arna

## 2023-12-21 DIAGNOSIS — F419 Anxiety disorder, unspecified: Secondary | ICD-10-CM | POA: Diagnosis not present

## 2023-12-21 DIAGNOSIS — F319 Bipolar disorder, unspecified: Secondary | ICD-10-CM | POA: Diagnosis not present

## 2023-12-22 ENCOUNTER — Encounter (HOSPITAL_COMMUNITY): Payer: Self-pay | Admitting: Obstetrics and Gynecology

## 2023-12-22 ENCOUNTER — Inpatient Hospital Stay (HOSPITAL_COMMUNITY)

## 2023-12-22 ENCOUNTER — Inpatient Hospital Stay (HOSPITAL_COMMUNITY)
Admission: AD | Admit: 2023-12-22 | Discharge: 2023-12-23 | Disposition: A | Discharge status: Other Acute Inpatient Hospital | Attending: Obstetrics and Gynecology | Admitting: Obstetrics and Gynecology

## 2023-12-22 ENCOUNTER — Ambulatory Visit

## 2023-12-22 ENCOUNTER — Encounter: Payer: Self-pay | Admitting: Certified Nurse Midwife

## 2023-12-22 DIAGNOSIS — O99342 Other mental disorders complicating pregnancy, second trimester: Secondary | ICD-10-CM | POA: Diagnosis not present

## 2023-12-22 DIAGNOSIS — O132 Gestational [pregnancy-induced] hypertension without significant proteinuria, second trimester: Secondary | ICD-10-CM | POA: Insufficient documentation

## 2023-12-22 DIAGNOSIS — Z3A27 27 weeks gestation of pregnancy: Secondary | ICD-10-CM | POA: Diagnosis not present

## 2023-12-22 DIAGNOSIS — J9811 Atelectasis: Secondary | ICD-10-CM | POA: Diagnosis not present

## 2023-12-22 DIAGNOSIS — F319 Bipolar disorder, unspecified: Secondary | ICD-10-CM | POA: Diagnosis not present

## 2023-12-22 DIAGNOSIS — R079 Chest pain, unspecified: Secondary | ICD-10-CM | POA: Diagnosis not present

## 2023-12-22 DIAGNOSIS — O365920 Maternal care for other known or suspected poor fetal growth, second trimester, not applicable or unspecified: Secondary | ICD-10-CM | POA: Diagnosis not present

## 2023-12-22 DIAGNOSIS — O09292 Supervision of pregnancy with other poor reproductive or obstetric history, second trimester: Secondary | ICD-10-CM | POA: Diagnosis not present

## 2023-12-22 DIAGNOSIS — N1330 Unspecified hydronephrosis: Secondary | ICD-10-CM | POA: Diagnosis not present

## 2023-12-22 DIAGNOSIS — K5900 Constipation, unspecified: Secondary | ICD-10-CM | POA: Diagnosis not present

## 2023-12-22 DIAGNOSIS — O1410 Severe pre-eclampsia, unspecified trimester: Secondary | ICD-10-CM | POA: Diagnosis present

## 2023-12-22 DIAGNOSIS — O26892 Other specified pregnancy related conditions, second trimester: Secondary | ICD-10-CM | POA: Insufficient documentation

## 2023-12-22 DIAGNOSIS — O139 Gestational [pregnancy-induced] hypertension without significant proteinuria, unspecified trimester: Secondary | ICD-10-CM | POA: Insufficient documentation

## 2023-12-22 DIAGNOSIS — O321XX0 Maternal care for breech presentation, not applicable or unspecified: Secondary | ICD-10-CM | POA: Insufficient documentation

## 2023-12-22 DIAGNOSIS — R7401 Elevation of levels of liver transaminase levels: Secondary | ICD-10-CM

## 2023-12-22 DIAGNOSIS — R1010 Upper abdominal pain, unspecified: Secondary | ICD-10-CM | POA: Insufficient documentation

## 2023-12-22 DIAGNOSIS — O99212 Obesity complicating pregnancy, second trimester: Secondary | ICD-10-CM

## 2023-12-22 DIAGNOSIS — O365930 Maternal care for other known or suspected poor fetal growth, third trimester, not applicable or unspecified: Secondary | ICD-10-CM | POA: Insufficient documentation

## 2023-12-22 DIAGNOSIS — Z8759 Personal history of other complications of pregnancy, childbirth and the puerperium: Secondary | ICD-10-CM | POA: Diagnosis present

## 2023-12-22 DIAGNOSIS — O365990 Maternal care for other known or suspected poor fetal growth, unspecified trimester, not applicable or unspecified: Secondary | ICD-10-CM | POA: Diagnosis present

## 2023-12-22 DIAGNOSIS — R109 Unspecified abdominal pain: Secondary | ICD-10-CM | POA: Diagnosis not present

## 2023-12-22 DIAGNOSIS — O1412 Severe pre-eclampsia, second trimester: Secondary | ICD-10-CM

## 2023-12-22 DIAGNOSIS — O99210 Obesity complicating pregnancy, unspecified trimester: Secondary | ICD-10-CM | POA: Diagnosis present

## 2023-12-22 DIAGNOSIS — Z6841 Body Mass Index (BMI) 40.0 and over, adult: Secondary | ICD-10-CM

## 2023-12-22 LAB — PROTEIN / CREATININE RATIO, URINE
Creatinine, Urine: 123 mg/dL
Protein Creatinine Ratio: 0.09 mg/mg{creat} (ref 0.00–0.15)
Total Protein, Urine: 11 mg/dL

## 2023-12-22 LAB — URINALYSIS, ROUTINE W REFLEX MICROSCOPIC
Bilirubin Urine: NEGATIVE
Glucose, UA: NEGATIVE mg/dL
Hgb urine dipstick: NEGATIVE
Ketones, ur: NEGATIVE mg/dL
Nitrite: NEGATIVE
Protein, ur: NEGATIVE mg/dL
Specific Gravity, Urine: 1.017 (ref 1.005–1.030)
pH: 6 (ref 5.0–8.0)

## 2023-12-22 MED ORDER — LABETALOL HCL 5 MG/ML IV SOLN
20.0000 mg | INTRAVENOUS | Status: DC | PRN
  Administered 2023-12-23: 20 mg via INTRAVENOUS
  Filled 2023-12-22: qty 4

## 2023-12-22 MED ORDER — LACTATED RINGERS IV SOLN: INTRAVENOUS | Status: DC

## 2023-12-22 MED ORDER — BETAMETHASONE SOD PHOS & ACET 6 (3-3) MG/ML IJ SUSP
12.0000 mg | INTRAMUSCULAR | Status: DC
  Administered 2023-12-23: 12 mg via INTRAMUSCULAR
  Filled 2023-12-22: qty 5

## 2023-12-22 MED ORDER — HYDRALAZINE HCL 20 MG/ML IJ SOLN: 10.0000 mg | INTRAMUSCULAR | Status: DC | PRN

## 2023-12-22 MED ORDER — LABETALOL HCL 5 MG/ML IV SOLN: 80.0000 mg | INTRAVENOUS | Status: DC | PRN

## 2023-12-22 MED ORDER — FENTANYL CITRATE (PF) 100 MCG/2ML IJ SOLN: 50.0000 ug | Freq: Once | INTRAMUSCULAR | Status: DC

## 2023-12-22 MED ORDER — LURASIDONE HCL 20 MG PO TABS
20.0000 mg | ORAL_TABLET | Freq: Every day | ORAL | Status: DC
  Administered 2023-12-23: 20 mg via ORAL
  Filled 2023-12-22 (×2): qty 1

## 2023-12-22 MED ORDER — MAGNESIUM SULFATE 40 GM/1000ML IV SOLN
2.0000 g/h | INTRAVENOUS | Status: DC
  Administered 2023-12-23: 2 g/h via INTRAVENOUS
  Filled 2023-12-22: qty 1000

## 2023-12-22 MED ORDER — LABETALOL HCL 5 MG/ML IV SOLN: 40.0000 mg | INTRAVENOUS | Status: DC | PRN

## 2023-12-22 MED ORDER — MAGNESIUM SULFATE BOLUS VIA INFUSION
6.0000 g | Freq: Once | INTRAVENOUS | Status: AC
  Administered 2023-12-23: 6 g via INTRAVENOUS
  Filled 2023-12-22: qty 1000

## 2023-12-22 NOTE — MAU Note (Addendum)
 Kelsey Shaffer is a 28 y.o. at [redacted]w[redacted]d here in MAU reporting upper abdominal pain since Sunday. She took Tylenol  which helped alittle. Pain returned Monday at noon. Went to [employer]. Given meds and labs and did not help. Was at MFM on Tues and had NST and she felt ok. Tues and Weds she was ok until Weds night. Sitting forward and putting pressure on her upper abdomen helps some. Pain radiates to her back. Denies n/v/d. Took Tylenol  500mg  at 5p. Describes pain like someone squeezing her in her abdomen and back. Taken lots of heartburn meds that are not helping. Reports good FM and denies VB or LOF  LMP: na Onset of complaint: Sunday Pain score: 10 Vitals:   12/22/23 2205 12/22/23 2211  BP:  (!) 153/96  Pulse: 83   Resp: 19   Temp: 98.5 F (36.9 C)   SpO2: 100%      FHT: 143  Lab orders placed from triage:none

## 2023-12-22 NOTE — Progress Notes (Signed)
 OB Triage History & Physical  12/22/2023 - 11:19 PM Primary OBGYN: [facility]  Chief Complaint: upper abdominal pain   History of Present Illness  28 y.o. G1P0 at [redacted]w[redacted]d, with the above CC. Pregnancy complicated by: new FGR with elevated UA dopplers diagnosed on 8/27 and new GHTN diagnosed on 9/2, BMI 40s, bipolar (on Latuda ).  Ms. Kelsey Shaffer states that on Sunday she started having upper abdominal pain/just inferior to the sternum. She went to [facility] ED and was given some medications but didn't really resolve s/s and she was discharged. BP there 155/92, normal CMP and lipase, no proteinuria and CBC wnl except for WBC at 13.6. pt states s/s got better but then got worse today and so she came in for evaluation.   No Neuro s/s, decreased FM or PTL s/s and no n/v/fevers/chills  Review of Systems:as noted in the History of Present Illness.  Patient Active Problem List   Diagnosis Date Noted   Gestational hypertension 12/22/2023   IUGR (intrauterine growth restriction) affecting care of mother 12/22/2023   BMI 40.0-44.9, adult (HCC) 12/22/2023   Severe pre-eclampsia 12/22/2023   Chronic foot pain, left 12/07/2023   Chronic left shoulder pain 12/07/2023   Chronic bilateral low back pain without sciatica 12/07/2023   Mild intermittent asthma without complication 12/07/2023   Autism spectrum 12/01/2023   Supervision of high risk pregnancy, antepartum 11/30/2023   Obesity affecting pregnancy, antepartum 10/27/2023   Bipolar 1 disorder, mixed (HCC) 10/30/2013    PMHx:  Past Medical History:  Diagnosis Date   Anemia    Asthma    Bipolar affective (HCC)    Panic anxiety syndrome    PTSD (post-traumatic stress disorder)    Vitamin D deficiency    PSHx:  Past Surgical History:  Procedure Laterality Date   WISDOM TOOTH EXTRACTION     Medications:  Medications Prior to Admission  Medication Sig Dispense Refill Last Dose/Taking    acetaminophen  (TYLENOL ) 500 MG tablet Take 500 mg by mouth every 6 (six) hours as needed.   Taking As Needed   lurasidone  (LATUDA ) 20 MG TABS tablet Take by mouth.   12/22/2023   magnesium  oxide (MAG-OX) 400 (240 Mg) MG tablet Take 400 mg by mouth daily.   Taking   Prenatal Vit-Fe Fumarate-FA (PRENATAL MULTIVITAMIN) TABS tablet Take 1 tablet by mouth daily at 12 noon.   12/22/2023   VITAMIN D PO Take by mouth.   12/22/2023   albuterol  (VENTOLIN  HFA) 108 (90 Base) MCG/ACT inhaler Inhale 2 puffs into the lungs every 6 (six) hours as needed for wheezing or shortness of breath. (Patient not taking: Reported on 12/20/2023) 8 g 11    Blood Pressure Monitoring (BLOOD PRESSURE KIT) DEVI 1 Device by Does not apply route as needed. 1 each 0    Allergies: is allergic to other and withania somnifera. OBHx:  OB History  Gravida Para Term Preterm AB Living  1       SAB IAB Ectopic Multiple Live Births          # Outcome Date GA Lbr Len/2nd Weight Sex Type Anes PTL Lv  1 Current               FHx:  Family History  Problem Relation Age of Onset   Brain cancer Maternal Grandfather    Asthma Neg Hx    Diabetes Neg Hx    Heart disease Neg Hx    Hypertension Neg Hx  Soc Hx:  Social History   Socioeconomic History   Marital status: Significant Other    Spouse name: Not on file   Number of children: Not on file   Years of education: Not on file   Highest education level: Not on file  Occupational History   Not on file  Tobacco Use   Smoking status: Never   Smokeless tobacco: Never  Vaping Use   Vaping status: Former   Substances: Nicotine  Substance and Sexual Activity   Alcohol use: Not Currently    Comment: social   Drug use: No   Sexual activity: Yes  Other Topics Concern   Not on file  Social History Narrative   Not on file   Social Drivers of Health   Financial Resource Strain: Not on file  Food Insecurity: No Food Insecurity (11/30/2023)   Hunger Vital Sign    Worried About  Running Out of Food in the Last Year: Never true    Ran Out of Food in the Last Year: Never true  Transportation Needs: No Transportation Needs (11/30/2023)   PRAPARE - Administrator, Civil Service (Medical): No    Lack of Transportation (Non-Medical): No  Physical Activity: Not on file  Stress: Not on file  Social Connections: Not on file  Intimate Partner Violence: Not on file    Objective  Patient Vitals for the past 12 hrs:  BP Temp Pulse Resp SpO2 Height Weight  12/22/23 2250 (!) 154/104 -- 86 -- -- -- --  12/22/23 2211 (!) 153/96 -- -- -- -- -- --  12/22/23 2205 -- 98.5 F (36.9 C) 83 19 100 % 5' 5 (1.651 m) 121.6 kg    Current Vital Signs 24h Vital Sign Ranges  T 98.5 F (36.9 C) Temp  Avg: 98.5 F (36.9 C)  Min: 98.5 F (36.9 C)  Max: 98.5 F (36.9 C)  BP (!) 154/104 BP  Min: 153/96  Max: 154/104  HR 86 Pulse  Avg: 84.5  Min: 83  Max: 86  RR 19 Resp  Avg: 19  Min: 19  Max: 19  SaO2 100 %   SpO2  Avg: 100 %  Min: 100 %  Max: 100 %       24 Hour I/O Current Shift I/O  Time Ins Outs No intake/output data recorded. No intake/output data recorded.   In the room, patient had two 160s/100s BPs  EFM: 155 baseline, +accels, ?slight variable, min to mod variability  Toco: quiet  General: Well nourished, well developed female in no acute distress.  Skin:  Warm and dry.  Cardiovascular: S1, S2 normal, no murmur, rub or gallop, regular rate and rhythm Respiratory:  Clear to auscultation bilateral. Normal respiratory effort Abdomen: obese, soft, nttp diffusely and where patient points Neuro/Psych:  Normal mood and affect.  Ext: trace LE edema bilaterally symmetric  Labs  As per HPI  Radiology No new imaging 9/2: AFI wnl, UA dopplers elevated 8/27: efw 5%, 756g, ac 12%, afi wnl, UA dopplers elevated  Assessment & Plan   28 y.o. G1P0 at [redacted]w[redacted]d with new severe pre-eclampsia based on BPs, at least; patient stable *Severe pre-eclampsia: d/w her re: HTN in  pregnancy and disease progression. I told her given severe pre-eclampsia, admission until delivery is needed. I told her decision for delivery would be made on worsening disease; f/u admit labs, Mg 6 & 2 ordered and IV labetalol  protocol.  *Upper abdominal pain: f/u admit labs. I d/w her that  will also get pCXR, ECG, troponin and BNP, D-dimer and she may need RUQ u/s, CT PE and/or CT A/P (appy rule out).  *Preterm: BMZ ordered. Will need to talk to nicu once labs and stability determined. I d/w pt potential need for transfer out depending on nicu availability.  *Bipolar: at bedtime latuda  ordered *FEN/GI: NPO for now.  *Analgesia: negative exam but pt endorses a lot of pain. Fentanyl  x 1 ordered   Bebe Izell Overcast MD Attending Center for [facility])   Update Patient states pain better after the one dose of fentanyl . Patient feeling tired from the Mg.  BPs better after dose of labetalol  and starting Mg and baby still with normal baseline. Difficult to trace and I can hear normal FHR in room and great FM. Bedside u/s done and normal FHR, +hiccups, ?oligo See below for labs  I d/w her that given WBC, s/s and location, I recommend ct to r/o appy and to get a ct pe given d-dimer and location. Pt amenable to this.  I also told her that LFTs not diagnostic of hellp but she is certainly concerning for heading that way. I told her that will keep pt to ensure stability and also get repeat UA dopplers and AFI check     Latest Ref Rng & Units 12/22/2023   11:47 PM 09/01/2023   12:00 AM  CBC  WBC 4.0 - 10.5 K/uL 19.2    Hemoglobin 12.0 - 15.0 g/dL 86.2  86.2      Hematocrit 36.0 - 46.0 % 40.8  42      Platelets 150 - 400 K/uL 204  335         This result is from an external source.      Latest Ref Rng & Units 12/22/2023   11:47 PM  CMP  Glucose 70 - 99 mg/dL 86   BUN 6 - 20 mg/dL 7   Creatinine 9.55 - 8.99 mg/dL 9.37   Sodium 864 - 854 mmol/L 133   Potassium 3.5  - 5.1 mmol/L 3.7   Chloride 98 - 111 mmol/L 103   CO2 22 - 32 mmol/L 20   Calcium 8.9 - 10.3 mg/dL 8.8   Total Protein 6.5 - 8.1 g/dL 6.8   Total Bilirubin 0.0 - 1.2 mg/dL 0.5   Alkaline Phos 38 - 126 U/L 155   AST 15 - 41 U/L 64   ALT 0 - 44 U/L 58    Troponin 6, BNP 48.2, d-dimer 7.95 microgram/mL, PCR 90, pCXR negative  Bebe Izell Raddle MD Attending Center for [employer] (Faculty Practice) 12/23/2023 Time: 0130   Update CT PE and A/P negative for PE, GI process. BPs 130s/80s and baby 130 baseline, +accels, no decel, mod variability, toco quiet. Prelim UA dopplers stable showing them to be elevated with AFI 9.7, baby breech. D/w her that I spoke to the NICU again and they prefer patient to be transferred out if stable. Will repeat labs and if stable pt amenable to transfer out; they are deciding between [place] Bebe Izell Raddle MD Attending [facility] [employer] ([facility]) 12/23/2023 Time: 0500

## 2023-12-23 ENCOUNTER — Inpatient Hospital Stay (HOSPITAL_BASED_OUTPATIENT_CLINIC_OR_DEPARTMENT_OTHER)

## 2023-12-23 ENCOUNTER — Inpatient Hospital Stay (HOSPITAL_COMMUNITY)

## 2023-12-23 DIAGNOSIS — O132 Gestational [pregnancy-induced] hypertension without significant proteinuria, second trimester: Secondary | ICD-10-CM | POA: Diagnosis not present

## 2023-12-23 DIAGNOSIS — E669 Obesity, unspecified: Secondary | ICD-10-CM

## 2023-12-23 DIAGNOSIS — R1084 Generalized abdominal pain: Secondary | ICD-10-CM | POA: Diagnosis not present

## 2023-12-23 DIAGNOSIS — R109 Unspecified abdominal pain: Secondary | ICD-10-CM | POA: Diagnosis not present

## 2023-12-23 DIAGNOSIS — K5900 Constipation, unspecified: Secondary | ICD-10-CM | POA: Diagnosis not present

## 2023-12-23 DIAGNOSIS — O99342 Other mental disorders complicating pregnancy, second trimester: Secondary | ICD-10-CM | POA: Diagnosis not present

## 2023-12-23 DIAGNOSIS — F319 Bipolar disorder, unspecified: Secondary | ICD-10-CM | POA: Diagnosis not present

## 2023-12-23 DIAGNOSIS — R079 Chest pain, unspecified: Secondary | ICD-10-CM | POA: Diagnosis not present

## 2023-12-23 DIAGNOSIS — Z3A27 27 weeks gestation of pregnancy: Secondary | ICD-10-CM

## 2023-12-23 DIAGNOSIS — J9811 Atelectasis: Secondary | ICD-10-CM | POA: Diagnosis not present

## 2023-12-23 DIAGNOSIS — O99212 Obesity complicating pregnancy, second trimester: Secondary | ICD-10-CM

## 2023-12-23 DIAGNOSIS — O99512 Diseases of the respiratory system complicating pregnancy, second trimester: Secondary | ICD-10-CM | POA: Diagnosis not present

## 2023-12-23 DIAGNOSIS — O365920 Maternal care for other known or suspected poor fetal growth, second trimester, not applicable or unspecified: Secondary | ICD-10-CM | POA: Diagnosis not present

## 2023-12-23 DIAGNOSIS — J45909 Unspecified asthma, uncomplicated: Secondary | ICD-10-CM | POA: Diagnosis not present

## 2023-12-23 DIAGNOSIS — N1330 Unspecified hydronephrosis: Secondary | ICD-10-CM | POA: Diagnosis not present

## 2023-12-23 DIAGNOSIS — O2690 Pregnancy related conditions, unspecified, unspecified trimester: Secondary | ICD-10-CM | POA: Diagnosis not present

## 2023-12-23 LAB — CBC
HCT: 38.5 % (ref 36.0–46.0)
Hemoglobin: 13.1 g/dL (ref 12.0–15.0)
MCH: 30.4 pg (ref 26.0–34.0)
MCHC: 34 g/dL (ref 30.0–36.0)
MCV: 89.3 fL (ref 80.0–100.0)
Platelets: 207 K/uL (ref 150–400)
RBC: 4.31 MIL/uL (ref 3.87–5.11)
RDW: 13.2 % (ref 11.5–15.5)
WBC: 17.6 K/uL — ABNORMAL HIGH (ref 4.0–10.5)
nRBC: 0 % (ref 0.0–0.2)

## 2023-12-23 LAB — CBC WITH DIFFERENTIAL/PLATELET
Abs Immature Granulocytes: 0.06 K/uL (ref 0.00–0.07)
Basophils Absolute: 0.1 K/uL (ref 0.0–0.1)
Basophils Relative: 1 %
Eosinophils Absolute: 0 K/uL (ref 0.0–0.5)
Eosinophils Relative: 0 %
HCT: 40.8 % (ref 36.0–46.0)
Hemoglobin: 13.7 g/dL (ref 12.0–15.0)
Immature Granulocytes: 0 %
Lymphocytes Relative: 14 %
Lymphs Abs: 2.6 K/uL (ref 0.7–4.0)
MCH: 30.3 pg (ref 26.0–34.0)
MCHC: 33.6 g/dL (ref 30.0–36.0)
MCV: 90.3 fL (ref 80.0–100.0)
Monocytes Absolute: 1.2 K/uL — ABNORMAL HIGH (ref 0.1–1.0)
Monocytes Relative: 6 %
Neutro Abs: 15.3 K/uL — ABNORMAL HIGH (ref 1.7–7.7)
Neutrophils Relative %: 79 %
Platelets: 204 K/uL (ref 150–400)
RBC: 4.52 MIL/uL (ref 3.87–5.11)
RDW: 13.1 % (ref 11.5–15.5)
WBC: 19.2 K/uL — ABNORMAL HIGH (ref 4.0–10.5)
nRBC: 0 % (ref 0.0–0.2)

## 2023-12-23 LAB — COMPREHENSIVE METABOLIC PANEL WITH GFR
ALT: 58 U/L — ABNORMAL HIGH (ref 0–44)
ALT: 60 U/L — ABNORMAL HIGH (ref 0–44)
AST: 61 U/L — ABNORMAL HIGH (ref 15–41)
AST: 64 U/L — ABNORMAL HIGH (ref 15–41)
Albumin: 2.7 g/dL — ABNORMAL LOW (ref 3.5–5.0)
Albumin: 2.9 g/dL — ABNORMAL LOW (ref 3.5–5.0)
Alkaline Phosphatase: 155 U/L — ABNORMAL HIGH (ref 38–126)
Alkaline Phosphatase: 161 U/L — ABNORMAL HIGH (ref 38–126)
Anion gap: 10 (ref 5–15)
Anion gap: 15 (ref 5–15)
BUN: 7 mg/dL (ref 6–20)
BUN: 8 mg/dL (ref 6–20)
CO2: 17 mmol/L — ABNORMAL LOW (ref 22–32)
CO2: 20 mmol/L — ABNORMAL LOW (ref 22–32)
Calcium: 8.2 mg/dL — ABNORMAL LOW (ref 8.9–10.3)
Calcium: 8.8 mg/dL — ABNORMAL LOW (ref 8.9–10.3)
Chloride: 102 mmol/L (ref 98–111)
Chloride: 103 mmol/L (ref 98–111)
Creatinine, Ser: 0.62 mg/dL (ref 0.44–1.00)
Creatinine, Ser: 0.8 mg/dL (ref 0.44–1.00)
GFR, Estimated: >60 mL/min (ref >60)
GFR, Estimated: >60 mL/min (ref >60)
Glucose, Bld: 128 mg/dL — ABNORMAL HIGH (ref 70–99)
Glucose, Bld: 86 mg/dL (ref 70–99)
Potassium: 3.7 mmol/L (ref 3.5–5.1)
Potassium: 4.2 mmol/L (ref 3.5–5.1)
Sodium: 133 mmol/L — ABNORMAL LOW (ref 135–145)
Sodium: 134 mmol/L — ABNORMAL LOW (ref 135–145)
Total Bilirubin: 0.5 mg/dL (ref 0.0–1.2)
Total Bilirubin: 0.6 mg/dL (ref 0.0–1.2)
Total Protein: 6.8 g/dL (ref 6.5–8.1)
Total Protein: 7.2 g/dL (ref 6.5–8.1)

## 2023-12-23 LAB — CULTURE, OB URINE: Culture: 30000 — AB

## 2023-12-23 LAB — D-DIMER, QUANTITATIVE: D-Dimer, Quant: 7.95 ug{FEU}/mL — ABNORMAL HIGH (ref 0.00–0.50)

## 2023-12-23 LAB — TYPE AND SCREEN: ABO/RH(D): O POS

## 2023-12-23 LAB — BRAIN NATRIURETIC PEPTIDE: B Natriuretic Peptide: 48.2 pg/mL (ref 0.0–100.0)

## 2023-12-23 LAB — TROPONIN I (HIGH SENSITIVITY): Troponin I (High Sensitivity): 6 ng/L (ref <18)

## 2023-12-23 LAB — LACTATE DEHYDROGENASE: LDH: 199 U/L — ABNORMAL HIGH (ref 98–192)

## 2023-12-23 MED ORDER — IOHEXOL 350 MG/ML SOLN
75.0000 mL | Freq: Once | INTRAVENOUS | Status: AC | PRN
  Administered 2023-12-23: 75 mL via INTRAVENOUS

## 2023-12-23 NOTE — MAU Note (Signed)
 Phlebotomy called again and on unit to draw pt's labs

## 2023-12-23 NOTE — MAU Note (Signed)
Pt vomited. Cool cloth to head

## 2023-12-23 NOTE — Progress Notes (Signed)
 Delayed entry due to patient care.   I evaluated the patient prior to her transfer. In brief, she is a 28yo G1 at [redacted]w[redacted]d with FGR (5%) with elevated UAD and new preeclampsia with SF (BP). She has elevated ALT/AST but not quite the twice the upper limit of normal.   She has had a thorough workup overnight as outlined by Dr. Izell that has not revealed an alternative source for her transaminitis, epigastric pain, or leukocytosis. She also had oxygen desaturations last night that we suspect are related to OSA as EKG, trop, BNP, and CTPE are all unremarkable. Fetal monitoring has been reactive and reassuring.  She has received IV medication to control her blood pressures and has started magnesium  for seizure ppx. She received betamethasone  as she has potential for delivery within the next 7 days if not sooner.   She requires inpatient admission for magnesium , blood pressure monitoring, and serial labs to assess for progression of her preeclampsia with severe features. Our NICU is at maximum capacity and is anticipated to be at maximum capacity for several days. The patient's infant will certainly require a NICU stay and there is a significant chance that she could require delivery prior to space being available in the NICU for her infant. In light of this, [facility] was contacted and Dr. Curtis Gutta accepted patient's transfer. She was transferred in stable condition.   Kieth Carolin, MD Obstetrician & Gynecologist, [facility] for [employer], [facility]

## 2023-12-23 NOTE — Progress Notes (Signed)
 Patient report given to EMS, IV pump reviewed with EMS and patient transported off unit.

## 2023-12-23 NOTE — MAU Note (Addendum)
 IV team consult placed due to pt being difficult stick and IV has to be above wrist for CT

## 2023-12-23 NOTE — MAU Note (Signed)
 Phlebotomy called as labs have not been drawn yet. States they will come as soon as poss.

## 2023-12-24 DIAGNOSIS — Z79899 Other long term (current) drug therapy: Secondary | ICD-10-CM | POA: Diagnosis not present

## 2023-12-24 DIAGNOSIS — O162 Unspecified maternal hypertension, second trimester: Secondary | ICD-10-CM | POA: Diagnosis not present

## 2023-12-24 DIAGNOSIS — F319 Bipolar disorder, unspecified: Secondary | ICD-10-CM | POA: Diagnosis not present

## 2023-12-24 DIAGNOSIS — O99212 Obesity complicating pregnancy, second trimester: Secondary | ICD-10-CM | POA: Diagnosis not present

## 2023-12-24 DIAGNOSIS — O99512 Diseases of the respiratory system complicating pregnancy, second trimester: Secondary | ICD-10-CM | POA: Diagnosis not present

## 2023-12-24 DIAGNOSIS — Z3A27 27 weeks gestation of pregnancy: Secondary | ICD-10-CM | POA: Diagnosis not present

## 2023-12-24 DIAGNOSIS — O132 Gestational [pregnancy-induced] hypertension without significant proteinuria, second trimester: Secondary | ICD-10-CM | POA: Diagnosis not present

## 2023-12-24 DIAGNOSIS — O99342 Other mental disorders complicating pregnancy, second trimester: Secondary | ICD-10-CM | POA: Diagnosis not present

## 2023-12-24 DIAGNOSIS — O365920 Maternal care for other known or suspected poor fetal growth, second trimester, not applicable or unspecified: Secondary | ICD-10-CM | POA: Diagnosis not present

## 2023-12-24 DIAGNOSIS — Z7952 Long term (current) use of systemic steroids: Secondary | ICD-10-CM | POA: Diagnosis not present

## 2023-12-24 DIAGNOSIS — J45909 Unspecified asthma, uncomplicated: Secondary | ICD-10-CM | POA: Diagnosis not present

## 2023-12-25 DIAGNOSIS — O365920 Maternal care for other known or suspected poor fetal growth, second trimester, not applicable or unspecified: Secondary | ICD-10-CM | POA: Diagnosis not present

## 2023-12-25 DIAGNOSIS — O132 Gestational [pregnancy-induced] hypertension without significant proteinuria, second trimester: Secondary | ICD-10-CM | POA: Diagnosis not present

## 2023-12-25 DIAGNOSIS — O99342 Other mental disorders complicating pregnancy, second trimester: Secondary | ICD-10-CM | POA: Diagnosis not present

## 2023-12-25 DIAGNOSIS — F319 Bipolar disorder, unspecified: Secondary | ICD-10-CM | POA: Diagnosis not present

## 2023-12-25 DIAGNOSIS — Z3A27 27 weeks gestation of pregnancy: Secondary | ICD-10-CM | POA: Diagnosis not present

## 2023-12-25 DIAGNOSIS — O99512 Diseases of the respiratory system complicating pregnancy, second trimester: Secondary | ICD-10-CM | POA: Diagnosis not present

## 2023-12-25 DIAGNOSIS — J45909 Unspecified asthma, uncomplicated: Secondary | ICD-10-CM | POA: Diagnosis not present

## 2023-12-25 DIAGNOSIS — Z7952 Long term (current) use of systemic steroids: Secondary | ICD-10-CM | POA: Diagnosis not present

## 2023-12-25 DIAGNOSIS — Z79899 Other long term (current) drug therapy: Secondary | ICD-10-CM | POA: Diagnosis not present

## 2023-12-25 DIAGNOSIS — O99212 Obesity complicating pregnancy, second trimester: Secondary | ICD-10-CM | POA: Diagnosis not present

## 2023-12-26 ENCOUNTER — Telehealth: Payer: Self-pay | Admitting: Certified Nurse Midwife

## 2023-12-26 DIAGNOSIS — N838 Other noninflammatory disorders of ovary, fallopian tube and broad ligament: Secondary | ICD-10-CM | POA: Diagnosis not present

## 2023-12-26 DIAGNOSIS — Z3A28 28 weeks gestation of pregnancy: Secondary | ICD-10-CM | POA: Diagnosis not present

## 2023-12-26 DIAGNOSIS — O43893 Other placental disorders, third trimester: Secondary | ICD-10-CM | POA: Diagnosis not present

## 2023-12-26 DIAGNOSIS — Z302 Encounter for sterilization: Secondary | ICD-10-CM | POA: Diagnosis not present

## 2023-12-26 NOTE — Telephone Encounter (Signed)
 Patient called requesting all her appointments be cancelled. She had her baby at 65 weeks at [facility].

## 2023-12-27 ENCOUNTER — Ambulatory Visit: Payer: Self-pay | Admitting: Obstetrics and Gynecology

## 2023-12-28 ENCOUNTER — Other Ambulatory Visit

## 2023-12-28 ENCOUNTER — Encounter: Payer: Self-pay | Admitting: Certified Nurse Midwife

## 2023-12-28 DIAGNOSIS — Z3490 Encounter for supervision of normal pregnancy, unspecified, unspecified trimester: Secondary | ICD-10-CM | POA: Diagnosis not present

## 2023-12-30 DIAGNOSIS — R001 Bradycardia, unspecified: Secondary | ICD-10-CM | POA: Diagnosis not present

## 2023-12-30 DIAGNOSIS — F319 Bipolar disorder, unspecified: Secondary | ICD-10-CM | POA: Diagnosis not present

## 2023-12-30 DIAGNOSIS — F419 Anxiety disorder, unspecified: Secondary | ICD-10-CM | POA: Diagnosis not present

## 2023-12-31 DIAGNOSIS — R55 Syncope and collapse: Secondary | ICD-10-CM | POA: Diagnosis not present

## 2023-12-31 DIAGNOSIS — H9190 Unspecified hearing loss, unspecified ear: Secondary | ICD-10-CM | POA: Diagnosis not present

## 2023-12-31 DIAGNOSIS — Z8759 Personal history of other complications of pregnancy, childbirth and the puerperium: Secondary | ICD-10-CM | POA: Diagnosis not present

## 2023-12-31 DIAGNOSIS — E669 Obesity, unspecified: Secondary | ICD-10-CM | POA: Diagnosis not present

## 2023-12-31 DIAGNOSIS — O9089 Other complications of the puerperium, not elsewhere classified: Secondary | ICD-10-CM | POA: Diagnosis not present

## 2023-12-31 DIAGNOSIS — Z8751 Personal history of pre-term labor: Secondary | ICD-10-CM | POA: Diagnosis not present

## 2023-12-31 DIAGNOSIS — O99215 Obesity complicating the puerperium: Secondary | ICD-10-CM | POA: Diagnosis not present

## 2023-12-31 DIAGNOSIS — R000 Tachycardia, unspecified: Secondary | ICD-10-CM | POA: Diagnosis not present

## 2023-12-31 DIAGNOSIS — Z98891 History of uterine scar from previous surgery: Secondary | ICD-10-CM | POA: Diagnosis not present

## 2024-01-02 ENCOUNTER — Other Ambulatory Visit

## 2024-01-02 ENCOUNTER — Ambulatory Visit

## 2024-01-03 DIAGNOSIS — O9089 Other complications of the puerperium, not elsewhere classified: Secondary | ICD-10-CM | POA: Diagnosis not present

## 2024-01-03 DIAGNOSIS — O1425 HELLP syndrome, complicating the puerperium: Secondary | ICD-10-CM | POA: Diagnosis not present

## 2024-01-03 DIAGNOSIS — Z8759 Personal history of other complications of pregnancy, childbirth and the puerperium: Secondary | ICD-10-CM | POA: Diagnosis not present

## 2024-01-03 DIAGNOSIS — R519 Headache, unspecified: Secondary | ICD-10-CM | POA: Diagnosis not present

## 2024-01-03 DIAGNOSIS — Z98891 History of uterine scar from previous surgery: Secondary | ICD-10-CM | POA: Diagnosis not present

## 2024-01-06 ENCOUNTER — Encounter: Payer: Self-pay | Admitting: Certified Nurse Midwife

## 2024-01-11 ENCOUNTER — Other Ambulatory Visit

## 2024-01-11 ENCOUNTER — Ambulatory Visit

## 2024-01-11 ENCOUNTER — Telehealth: Payer: Self-pay | Admitting: *Deleted

## 2024-01-11 NOTE — Telephone Encounter (Signed)
 Patient left a voice message she left a mychart message last week and hasn't heard back. States really trying to reach [facility] and would like someone to call her. I discussed with Jamilla and she will message or call patient back. Rock Skip PEAK

## 2024-01-12 DIAGNOSIS — F319 Bipolar disorder, unspecified: Secondary | ICD-10-CM | POA: Diagnosis not present

## 2024-01-12 DIAGNOSIS — F419 Anxiety disorder, unspecified: Secondary | ICD-10-CM | POA: Diagnosis not present

## 2024-02-07 DIAGNOSIS — F419 Anxiety disorder, unspecified: Secondary | ICD-10-CM | POA: Diagnosis not present

## 2024-02-07 DIAGNOSIS — F39 Unspecified mood [affective] disorder: Secondary | ICD-10-CM | POA: Diagnosis not present
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
## 2024-02-08 ENCOUNTER — Encounter: Payer: Self-pay | Admitting: Certified Nurse Midwife

## 2024-02-08 ENCOUNTER — Other Ambulatory Visit: Payer: Self-pay

## 2024-02-08 ENCOUNTER — Ambulatory Visit: Admitting: Certified Nurse Midwife

## 2024-02-08 DIAGNOSIS — Z8759 Personal history of other complications of pregnancy, childbirth and the puerperium: Secondary | ICD-10-CM | POA: Diagnosis not present

## 2024-02-08 NOTE — Patient Instructions (Signed)
 Midwife Jam's Lactation Bars Preheat oven to 350 degrees and grease a 9x13 pan Combine your fats and sugars: - 1c salted butter - 1/2-3/4c peanut butter - 2 eggs - 2tbsp flaxmeal in 4tbsp water - 1tsp vanilla - 1c white sugar - 1c brown sugar Mix the following dry ingredients in a separate bowl: - 2c flour - 3 tbsp Brewer's yeast - 1tsp baking soda - 1tsp salt Slowly add the dry into the wet mix. Once well combined, stir in the following: - 3c rolled oats  - 1/4c hemp hearts - 1c (or more) chocolate chips  Bake for .  Can sub almond butter and add 1tsp cinnamon and raisins or dried cranberries.

## 2024-02-08 NOTE — Progress Notes (Signed)
 Postpartum Visit Note  Kelsey Shaffer is a 28 y.o. G1P0 female who presents for a postpartum visit. She is 6 weeks postpartum following a primary cesarean section (breech presentation).  I have fully reviewed the prenatal and intrapartum course. The delivery was at 28/0 gestational weeks due to severe preeclampsia and HELLP (with severe upper abdominal pain).  Anesthesia: spinal. Postpartum course has been uncomplicated for her, she's recovered very well. Said the post-op pain was significantly less than what she felt prior to delivery. Baby is doing well, still in the NICU but progressing well. Baby is feeding by bottle (formula and a little breastmilk). Bleeding staining only. Bowel function is normal. Bladder function is normal. Patient is not sexually active. Contraception method is tubal ligation. Postpartum depression screening: positive, but has started seeing a perinatal mental health therapist.   Van Postnatal Depression Scale - 02/08/24 0850       Edinburgh Postnatal Depression Scale:  In the Past 7 Days   I have been able to laugh and see the funny side of things. 2    I have looked forward with enjoyment to things. 2    I have blamed myself unnecessarily when things went wrong. 2    I have been anxious or worried for no good reason. 2    I have felt scared or panicky for no good reason. 2    Things have been getting on top of me. 1    I have been so unhappy that I have had difficulty sleeping. 1    I have felt sad or miserable. 2    I have been so unhappy that I have been crying. 2    The thought of harming myself has occurred to me. 0    Edinburgh Postnatal Depression Scale Total 16         Health Maintenance Due  Topic Date Due   Pneumococcal Vaccine (1 of 2 - PCV) Never done   Hepatitis B Vaccines 19-59 Average Risk (1 of 3 - 19+ 3-dose series) Never done   HPV VACCINES (1 - 3-dose SCDM series) Never done   Cervical Cancer Screening (HPV/Pap Cotest)  06/12/2023    COVID-19 Vaccine (1 - 2025-26 season) Never done   The following portions of the patient's history were reviewed and updated as appropriate: allergies, current medications, past family history, past medical history, past social history, past surgical history, and problem list.  Review of Systems Pertinent items noted in HPI and remainder of comprehensive ROS otherwise negative.  Objective:  BP 137/78   Pulse 85   Ht 5' 5 (1.651 m)   Wt 282 lb 1.6 oz (128 kg)   Breastfeeding Yes   BMI 46.94 kg/m    Constitutional: Alert, oriented female in no physical distress.  HEENT: PERRLA Skin: normal color and turgor, no rash Cardiovascular: normal rate & rhythm Respiratory: normal effort, no problems with respiration noted GI: Abd soft, non-tender MS: Extremities nontender, no edema, normal ROM Neurologic: Alert and oriented x 4.  GU: no CVA tenderness Pelvic: exam deferred Breasts: normal lactating breasts, no edema or signs of nipple damage Incision: well-approximated without exudate, redness or edema.   Assessment:   1. Postpartum care and examination (Primary) - Recovering well physically, mental health is challenging right now but appropriately so given her traumatic mid-pregnancy delivery and NICU stay   Plan:   Essential components of care per ACOG recommendations:  1.  Mood and well being: Patient with positive depression screening today.  Reviewed local resources for support.  - Patient tobacco use? No.   - hx of drug use? No.    2. Infant care and feeding:  -Patient currently breastmilk feeding? Yes. Reviewed importance of draining breast regularly to support lactation.  -Social determinants of health (SDOH) reviewed in EPIC. No concerns 3. Sexuality, contraception and birth spacing - Patient does not want a pregnancy in the next year.  Desired family size is 1 child.  - Reviewed reproductive life planning. Reviewed contraceptive methods based on pt preferences and  effectiveness.  Patient desired Female Sterilization today.   - Discussed birth spacing of 18 months  4. Sleep and fatigue -Encouraged family/partner/community support of 4 hrs of uninterrupted sleep to help with mood and fatigue  5. Physical Recovery  - Discussed patients delivery and complications. She describes her labor as bad. - Patient had a C-section emergent.  - Patient has urinary incontinence? No. - Patient is safe to resume physical and sexual activity  6.  Health Maintenance - HM due items addressed Yes - Last pap smear No results found for: DIAGPAP Pap smear not done at today's visit.  -Breast Cancer screening indicated? No.   7. Chronic Disease/Pregnancy Condition follow up: None - PCP follow up as needed  Cornell JONELLE Finder, CNM [facility] [employer], [facility]

## 2024-02-22 DIAGNOSIS — F419 Anxiety disorder, unspecified: Secondary | ICD-10-CM | POA: Diagnosis not present

## 2024-02-22 DIAGNOSIS — F319 Bipolar disorder, unspecified: Secondary | ICD-10-CM | POA: Diagnosis not present

## 2024-03-19 ENCOUNTER — Inpatient Hospital Stay (HOSPITAL_COMMUNITY): Admission: RE | Admit: 2024-03-19 | Source: Home / Self Care | Admitting: Family Medicine

## 2024-06-13 ENCOUNTER — Encounter: Payer: Self-pay | Admitting: Family Medicine
